# Patient Record
Sex: Female | Born: 1935 | Race: Black or African American | Hispanic: No | Marital: Single | Smoking: Never smoker
Health system: Southern US, Community
[De-identification: ages and names within clinical notes are randomized; demographics above are authoritative.]

## PROBLEM LIST (undated history)

## (undated) DIAGNOSIS — I340 Nonrheumatic mitral (valve) insufficiency: Secondary | ICD-10-CM

## (undated) DIAGNOSIS — Z95810 Presence of automatic (implantable) cardiac defibrillator: Secondary | ICD-10-CM

## (undated) DIAGNOSIS — I119 Hypertensive heart disease without heart failure: Secondary | ICD-10-CM

## (undated) DIAGNOSIS — I5022 Chronic systolic (congestive) heart failure: Secondary | ICD-10-CM

## (undated) DIAGNOSIS — I2721 Secondary pulmonary arterial hypertension: Secondary | ICD-10-CM

## (undated) DIAGNOSIS — N183 Chronic kidney disease, stage 3 unspecified: Secondary | ICD-10-CM

## (undated) DIAGNOSIS — I428 Other cardiomyopathies: Secondary | ICD-10-CM

## (undated) DIAGNOSIS — I071 Rheumatic tricuspid insufficiency: Secondary | ICD-10-CM

---

## 2006-04-26 HISTORY — PX: CARDIAC CATHETERIZATION: SHX172

## 2006-06-29 ENCOUNTER — Inpatient Hospital Stay (HOSPITAL_COMMUNITY): Admission: EM | Admit: 2006-06-29 | Discharge: 2006-07-07 | Payer: Self-pay | Admitting: Emergency Medicine

## 2006-06-29 ENCOUNTER — Ambulatory Visit: Payer: Self-pay | Admitting: Cardiology

## 2006-06-30 ENCOUNTER — Encounter: Payer: Self-pay | Admitting: Cardiology

## 2006-07-26 ENCOUNTER — Ambulatory Visit: Payer: Self-pay | Admitting: Cardiology

## 2006-07-26 LAB — CONVERTED CEMR LAB
BUN: 24 mg/dL — ABNORMAL HIGH (ref 6–23)
Calcium: 9.3 mg/dL (ref 8.4–10.5)
Chloride: 98 meq/L (ref 96–112)
GFR calc Af Amer: 57 mL/min
GFR calc non Af Amer: 47 mL/min
Pro B Natriuretic peptide (BNP): 1350 pg/mL — ABNORMAL HIGH (ref 0.0–100.0)

## 2006-08-04 ENCOUNTER — Ambulatory Visit: Payer: Self-pay | Admitting: Cardiology

## 2006-09-05 ENCOUNTER — Ambulatory Visit: Payer: Self-pay | Admitting: Cardiology

## 2006-09-05 LAB — CONVERTED CEMR LAB
BUN: 21 mg/dL (ref 6–23)
Calcium: 9.2 mg/dL (ref 8.4–10.5)
Chloride: 106 meq/L (ref 96–112)
GFR calc Af Amer: 63 mL/min
GFR calc non Af Amer: 52 mL/min
Pro B Natriuretic peptide (BNP): 1321 pg/mL — ABNORMAL HIGH (ref 0.0–100.0)

## 2006-11-29 ENCOUNTER — Ambulatory Visit: Payer: Self-pay | Admitting: Internal Medicine

## 2006-11-29 LAB — CONVERTED CEMR LAB
BUN: 27 mg/dL — ABNORMAL HIGH (ref 6–23)
Calcium: 9.4 mg/dL (ref 8.4–10.5)
Chloride: 102 meq/L (ref 96–112)
GFR calc Af Amer: 57 mL/min
GFR calc non Af Amer: 47 mL/min
Glucose, Bld: 93 mg/dL (ref 70–99)
Pro B Natriuretic peptide (BNP): 616 pg/mL — ABNORMAL HIGH (ref 0.0–100.0)

## 2006-12-13 ENCOUNTER — Ambulatory Visit: Payer: Self-pay

## 2006-12-29 ENCOUNTER — Ambulatory Visit: Payer: Self-pay | Admitting: Internal Medicine

## 2006-12-29 LAB — CONVERTED CEMR LAB
Calcium: 9.2 mg/dL (ref 8.4–10.5)
GFR calc Af Amer: 48 mL/min
GFR calc non Af Amer: 40 mL/min
Glucose, Bld: 103 mg/dL — ABNORMAL HIGH (ref 70–99)

## 2007-01-12 ENCOUNTER — Ambulatory Visit: Payer: Self-pay | Admitting: Internal Medicine

## 2007-01-12 LAB — CONVERTED CEMR LAB
BUN: 35 mg/dL — ABNORMAL HIGH (ref 6–23)
Chloride: 102 meq/L (ref 96–112)
GFR calc non Af Amer: 40 mL/min
Potassium: 4.4 meq/L (ref 3.5–5.1)
Pro B Natriuretic peptide (BNP): 643 pg/mL — ABNORMAL HIGH (ref 0.0–100.0)
Sodium: 140 meq/L (ref 135–145)

## 2007-02-14 ENCOUNTER — Ambulatory Visit: Payer: Self-pay | Admitting: Internal Medicine

## 2007-03-27 ENCOUNTER — Ambulatory Visit: Payer: Self-pay | Admitting: Internal Medicine

## 2007-03-27 LAB — CONVERTED CEMR LAB
BUN: 62 mg/dL — ABNORMAL HIGH (ref 6–23)
Chloride: 103 meq/L (ref 96–112)
GFR calc non Af Amer: 28 mL/min
Potassium: 4.7 meq/L (ref 3.5–5.1)
Pro B Natriuretic peptide (BNP): 554 pg/mL — ABNORMAL HIGH (ref 0.0–100.0)
Sodium: 138 meq/L (ref 135–145)

## 2007-04-27 HISTORY — PX: CARDIAC DEFIBRILLATOR PLACEMENT: SHX171

## 2007-05-02 ENCOUNTER — Ambulatory Visit: Payer: Self-pay

## 2007-05-02 ENCOUNTER — Encounter: Payer: Self-pay | Admitting: Internal Medicine

## 2007-05-19 ENCOUNTER — Ambulatory Visit: Payer: Self-pay | Admitting: Internal Medicine

## 2007-05-30 ENCOUNTER — Inpatient Hospital Stay (HOSPITAL_COMMUNITY): Admission: AD | Admit: 2007-05-30 | Discharge: 2007-05-31 | Payer: Self-pay | Admitting: Internal Medicine

## 2007-05-30 ENCOUNTER — Ambulatory Visit: Payer: Self-pay | Admitting: Internal Medicine

## 2007-06-14 ENCOUNTER — Ambulatory Visit: Payer: Self-pay | Admitting: Internal Medicine

## 2007-06-14 ENCOUNTER — Ambulatory Visit: Payer: Self-pay

## 2007-06-14 LAB — CONVERTED CEMR LAB
GFR calc non Af Amer: 34 mL/min
Potassium: 4.1 meq/L (ref 3.5–5.1)
Sodium: 138 meq/L (ref 135–145)

## 2007-06-21 ENCOUNTER — Ambulatory Visit: Payer: Self-pay | Admitting: Cardiology

## 2007-08-18 ENCOUNTER — Ambulatory Visit: Payer: Self-pay | Admitting: Cardiology

## 2007-08-29 ENCOUNTER — Ambulatory Visit: Payer: Self-pay | Admitting: Internal Medicine

## 2007-08-29 ENCOUNTER — Ambulatory Visit: Payer: Self-pay | Admitting: Cardiology

## 2007-08-29 LAB — CONVERTED CEMR LAB
Calcium: 9.2 mg/dL (ref 8.4–10.5)
GFR calc Af Amer: 44 mL/min
GFR calc non Af Amer: 36 mL/min
Sodium: 138 meq/L (ref 135–145)

## 2007-10-24 ENCOUNTER — Ambulatory Visit: Payer: Self-pay | Admitting: Internal Medicine

## 2007-12-28 ENCOUNTER — Ambulatory Visit: Payer: Self-pay | Admitting: Internal Medicine

## 2008-01-23 ENCOUNTER — Ambulatory Visit: Payer: Self-pay | Admitting: Cardiology

## 2008-01-23 LAB — CONVERTED CEMR LAB
GFR calc Af Amer: 44 mL/min
GFR calc non Af Amer: 36 mL/min
Potassium: 4.2 meq/L (ref 3.5–5.1)
Sodium: 138 meq/L (ref 135–145)

## 2008-03-29 ENCOUNTER — Ambulatory Visit: Payer: Self-pay | Admitting: Internal Medicine

## 2008-04-16 ENCOUNTER — Ambulatory Visit: Payer: Self-pay | Admitting: Internal Medicine

## 2008-04-16 DIAGNOSIS — I110 Hypertensive heart disease with heart failure: Secondary | ICD-10-CM | POA: Insufficient documentation

## 2008-04-16 DIAGNOSIS — Z95810 Presence of automatic (implantable) cardiac defibrillator: Secondary | ICD-10-CM

## 2008-04-16 DIAGNOSIS — I447 Left bundle-branch block, unspecified: Secondary | ICD-10-CM

## 2008-04-16 LAB — CONVERTED CEMR LAB
BUN: 38 mg/dL — ABNORMAL HIGH (ref 6–23)
GFR calc Af Amer: 41 mL/min
GFR calc non Af Amer: 34 mL/min
Potassium: 4.3 meq/L (ref 3.5–5.1)
Sodium: 138 meq/L (ref 135–145)

## 2008-07-05 ENCOUNTER — Encounter: Payer: Self-pay | Admitting: Internal Medicine

## 2008-07-10 ENCOUNTER — Ambulatory Visit: Payer: Self-pay | Admitting: Internal Medicine

## 2008-07-10 ENCOUNTER — Encounter: Payer: Self-pay | Admitting: Internal Medicine

## 2008-07-10 DIAGNOSIS — N182 Chronic kidney disease, stage 2 (mild): Secondary | ICD-10-CM | POA: Insufficient documentation

## 2008-07-10 DIAGNOSIS — I428 Other cardiomyopathies: Secondary | ICD-10-CM | POA: Insufficient documentation

## 2008-10-08 ENCOUNTER — Encounter: Payer: Self-pay | Admitting: Internal Medicine

## 2008-10-08 ENCOUNTER — Ambulatory Visit: Payer: Self-pay | Admitting: Internal Medicine

## 2008-10-09 ENCOUNTER — Telehealth (INDEPENDENT_AMBULATORY_CARE_PROVIDER_SITE_OTHER): Payer: Self-pay | Admitting: Nurse Practitioner

## 2008-10-09 LAB — CONVERTED CEMR LAB
BUN: 53 mg/dL — ABNORMAL HIGH (ref 6–23)
GFR calc non Af Amer: 33.45 mL/min (ref >60)
Potassium: 4.8 meq/L (ref 3.5–5.1)
Pro B Natriuretic peptide (BNP): 653 pg/mL — ABNORMAL HIGH (ref 0.0–100.0)
Sodium: 137 meq/L (ref 135–145)

## 2008-10-11 ENCOUNTER — Telehealth (INDEPENDENT_AMBULATORY_CARE_PROVIDER_SITE_OTHER): Payer: Self-pay | Admitting: Nurse Practitioner

## 2008-10-14 ENCOUNTER — Ambulatory Visit: Payer: Self-pay | Admitting: Nurse Practitioner

## 2008-10-17 LAB — CONVERTED CEMR LAB
Calcium: 9.1 mg/dL (ref 8.4–10.5)
Chloride: 102 meq/L (ref 96–112)
Creatinine, Ser: 1.9 mg/dL — ABNORMAL HIGH (ref 0.4–1.2)
GFR calc non Af Amer: 33.45 mL/min (ref >60)
Pro B Natriuretic peptide (BNP): 541 pg/mL — ABNORMAL HIGH (ref 0.0–100.0)

## 2008-10-18 ENCOUNTER — Telehealth (INDEPENDENT_AMBULATORY_CARE_PROVIDER_SITE_OTHER): Payer: Self-pay | Admitting: Nurse Practitioner

## 2008-10-23 ENCOUNTER — Encounter (INDEPENDENT_AMBULATORY_CARE_PROVIDER_SITE_OTHER): Payer: Self-pay | Admitting: Nurse Practitioner

## 2008-11-01 ENCOUNTER — Telehealth: Payer: Self-pay | Admitting: Cardiology

## 2008-11-18 ENCOUNTER — Ambulatory Visit: Payer: Self-pay | Admitting: Cardiology

## 2008-11-20 LAB — CONVERTED CEMR LAB
CO2: 28 meq/L (ref 19–32)
Calcium: 8.9 mg/dL (ref 8.4–10.5)
GFR calc non Af Amer: 33.44 mL/min (ref >60)
Sodium: 140 meq/L (ref 135–145)

## 2008-11-28 ENCOUNTER — Telehealth (INDEPENDENT_AMBULATORY_CARE_PROVIDER_SITE_OTHER): Payer: Self-pay | Admitting: *Deleted

## 2008-12-26 ENCOUNTER — Ambulatory Visit: Payer: Self-pay | Admitting: Internal Medicine

## 2009-02-27 ENCOUNTER — Ambulatory Visit: Payer: Self-pay | Admitting: Internal Medicine

## 2009-03-03 ENCOUNTER — Encounter: Payer: Self-pay | Admitting: Internal Medicine

## 2009-03-17 ENCOUNTER — Encounter (INDEPENDENT_AMBULATORY_CARE_PROVIDER_SITE_OTHER): Payer: Self-pay | Admitting: *Deleted

## 2009-03-28 ENCOUNTER — Telehealth: Payer: Self-pay | Admitting: Cardiology

## 2009-04-15 ENCOUNTER — Ambulatory Visit: Payer: Self-pay | Admitting: Internal Medicine

## 2009-04-15 LAB — CONVERTED CEMR LAB
Chloride: 100 meq/L (ref 96–112)
Potassium: 4.2 meq/L (ref 3.5–5.1)
Pro B Natriuretic peptide (BNP): 475 pg/mL — ABNORMAL HIGH (ref 0.0–100.0)
Sodium: 137 meq/L (ref 135–145)

## 2009-04-17 ENCOUNTER — Encounter: Payer: Self-pay | Admitting: Cardiology

## 2009-07-22 ENCOUNTER — Ambulatory Visit: Payer: Self-pay | Admitting: Internal Medicine

## 2009-07-22 DIAGNOSIS — I5022 Chronic systolic (congestive) heart failure: Secondary | ICD-10-CM

## 2009-10-23 ENCOUNTER — Ambulatory Visit: Payer: Self-pay | Admitting: Internal Medicine

## 2010-01-22 ENCOUNTER — Ambulatory Visit: Payer: Self-pay | Admitting: Internal Medicine

## 2010-01-28 ENCOUNTER — Ambulatory Visit (HOSPITAL_COMMUNITY): Admission: RE | Admit: 2010-01-28 | Discharge: 2010-01-28 | Payer: Self-pay | Admitting: Internal Medicine

## 2010-01-28 ENCOUNTER — Encounter: Payer: Self-pay | Admitting: Internal Medicine

## 2010-01-28 ENCOUNTER — Ambulatory Visit: Payer: Self-pay

## 2010-01-28 ENCOUNTER — Ambulatory Visit: Payer: Self-pay | Admitting: Cardiovascular Disease

## 2010-04-06 ENCOUNTER — Telehealth: Payer: Self-pay | Admitting: Internal Medicine

## 2010-04-07 ENCOUNTER — Ambulatory Visit: Payer: Self-pay | Admitting: Internal Medicine

## 2010-04-07 ENCOUNTER — Encounter: Payer: Self-pay | Admitting: Internal Medicine

## 2010-04-07 DIAGNOSIS — I4902 Ventricular flutter: Secondary | ICD-10-CM

## 2010-04-22 ENCOUNTER — Telehealth: Payer: Self-pay | Admitting: Internal Medicine

## 2010-05-07 ENCOUNTER — Ambulatory Visit
Admission: RE | Admit: 2010-05-07 | Discharge: 2010-05-07 | Payer: Self-pay | Source: Home / Self Care | Attending: Internal Medicine | Admitting: Internal Medicine

## 2010-05-07 ENCOUNTER — Other Ambulatory Visit: Payer: Self-pay | Admitting: Internal Medicine

## 2010-05-07 LAB — BASIC METABOLIC PANEL
BUN: 35 mg/dL — ABNORMAL HIGH (ref 6–23)
CO2: 28 mEq/L (ref 19–32)
Calcium: 8.8 mg/dL (ref 8.4–10.5)
Chloride: 103 mEq/L (ref 96–112)
Creatinine, Ser: 1.5 mg/dL — ABNORMAL HIGH (ref 0.4–1.2)
GFR: 42.76 mL/min — ABNORMAL LOW (ref >60.00)
Glucose, Bld: 93 mg/dL (ref 70–99)
Potassium: 4.3 mEq/L (ref 3.5–5.1)
Sodium: 136 mEq/L (ref 135–145)

## 2010-05-26 NOTE — Cardiovascular Report (Signed)
Summary: Office Visit Remote   Office Visit Remote   Imported By: Roderic Ovens 11/19/2009 16:06:21  _____________________________________________________________________  External Attachment:    Type:   Image     Comment:   External Document

## 2010-05-26 NOTE — Cardiovascular Report (Signed)
Summary: Office Visit Remote   Office Visit Remote   Imported By: Roderic Ovens 03/05/2010 15:18:56  _____________________________________________________________________  External Attachment:    Type:   Image     Comment:   External Document

## 2010-05-26 NOTE — Assessment & Plan Note (Signed)
Summary: 1 YR/GUIDENT/DMP   Visit Type:  Pacemaker check Primary Provider:  no   History of Present Illness: Michele Caldwell is seen in for congestivefailure in the context of nonischemic cardiomyopathy with LBBB.  She is status post ICD implantation without LV lead that was complicated by high defibrillation threshold requiring the use of a Guidant high-voltage device. Functional status preprocedure was class 1-2 and she did not receive CRT.   She is doing very well. She has a minimal limitations in her exercise tolerance she is able to walk around [place]. Sh  has no edema;  she is having no chest pain.     Current Medications (verified): 1)  Furosemide 40 Mg Tabs (Furosemide) .... Take One Tablet By Mouth Daily. 2)  Carvedilol 12.5 Mg Tabs (Carvedilol) .[name].. 1 1/2 Two Times A Day 3)  Ramipril 1.25 Mg Caps (Ramipril) .... Take One Capsule By Mouth Daily 4)  Aspirin 81 Mg Tbec (Aspirin) .... Take One Tablet By Mouth Daily 5)  Spironolactone 25 Mg Tabs (Spironolactone) .... Take One Tablet By Mouth Daily  Allergies (verified): No Known Drug Allergies  Vital Signs:  Patient profile:   76 year old female Height:      66 inches Weight:      196 pounds BMI:     31.75 Pulse rate:   70 / minute BP sitting:   109 / 68  (left arm) Cuff size:   large  Vitals Entered By: Oswald Hillock (July 22, 2009 9:25 AM)  Physical Exam  General:  The patient was alert and oriented in no acute distress. HEENT Normal.  Neck veins were flat, carotids were brisk.  Lungs were clear.  Heart sounds were regular without murmurs or gallops.  Abdomen was soft with active bowel sounds. There is no clubbing cyanosis or edema. Skin Warm and dry     ICD Specifications Following MD:  Sherryl Manges, MD     Referring MD:  [facility] ICD Vendor:  [facility] [employer]     ICD Model Number:  [phone]     ICD Serial Number:  [id number] ICD DOI:  05/30/2007     ICD Implanting MD:  Sherryl Manges, MD  Lead 1:     Location: RV     DOI: 05/30/2007     Model #: [id number]     Serial #: [id number]     Status: active  Indications::  NICM   ICD Follow Up Remote Check?  No Battery Voltage:  2.98 V     Charge Time:  8.7 seconds     Underlying rhythm:  SR ICD Dependent:  No       ICD Device Measurements Right Ventricle:  Amplitude: 25 mV, Impedance: 548 ohms, Threshold: 0.6 V at 0.5 msec  Episodes Coumadin:  No Shock:  0     ATP:  0     Nonsustained:  0     Ventricular Pacing:  0%  Brady Parameters Mode VVI     Lower Rate Limit:  40      Tachy Zones VF:  240     VT:  210     VT1:  180     Next Remote Date:  10/23/2009     Next Cardiology Appt Due:  06/25/2010 Tech Comments:  No parameter changes.  Device function normal.  Checked by industry.  Latitude transmissions every 3 months.  ROV 1 year with Dr. Graciela Husbands. Altha Harm, LPN  July 22, 2009 9:36 AM   Impression & Recommendations:  Problem # 1:  SYSTOLIC HEART FAILURE, CHRONIC (ICD-428.22) patient's heart failure status is stable. Her blood pressure precludes further up titration of her carvedilol. Her updated medication list for this problem includes:    Furosemide 40 Mg Tabs (Furosemide) .[name]... Take one tablet by mouth daily.    Carvedilol 12.5 Mg Tabs (Carvedilol) .[name]... 1 1/2 two times a day    Ramipril 1.25 Mg Caps (Ramipril) .[name]... Take one capsule by mouth daily    Aspirin 81 Mg Tbec (Aspirin) .[name]... Take one tablet by mouth daily    Spironolactone 25 Mg Tabs (Spironolactone) .[name]... Take one tablet by mouth daily  Problem # 2:  CARDIOMYOPATHY, PRIMARY, DILATED (ICD-425.4) Will plan to reassess left ventricular function by echocardiography Her updated medication list for this problem includes:    Furosemide 40 Mg Tabs (Furosemide) .[name]... Take one tablet by mouth daily.    Carvedilol 12.5 Mg Tabs (Carvedilol) .[name]... 1 1/2 two times a day    Ramipril 1.25 Mg Caps (Ramipril) .[name]... Take one capsule by mouth daily    Aspirin 81 Mg Tbec (Aspirin) .[name]... Take  one tablet by mouth daily    Spironolactone 25 Mg Tabs (Spironolactone) .[name]... Take one tablet by mouth daily  Problem # 3:  ICD - IN SITU (ICD-V45.02) Device parameters and data were reviewed and no changes were made; She has had no intercurrent episodes and is not ventricular pacing.  Patient Instructions: 1)  Your physician recommends that you continue on your current medications as directed. Please refer to the Current Medication list given to you today. 2)  Your physician wants you to follow-up in: 6 MONTHS WITH DR Graciela Husbands.  You will receive a reminder letter in the mail two months in advance. If you don't receive a letter, please call our office to schedule the follow-up appointment.

## 2010-05-28 LAB — CONVERTED CEMR LAB
BUN: 56 mg/dL — ABNORMAL HIGH
CO2: 26 meq/L
Calcium: 9.1 mg/dL
Chloride: 103 meq/L
Creatinine, Ser: 2 mg/dL — ABNORMAL HIGH
GFR calc non Af Amer: 31.04 mL/min — ABNORMAL LOW
Glucose, Bld: 86 mg/dL
Magnesium: 2.4 mg/dL
Potassium: 4.6 meq/L
Sodium: 138 meq/L

## 2010-05-28 NOTE — Assessment & Plan Note (Signed)
Summary: shob, dizzy, tired-per granddaughter   Visit Type:  Follow-up Primary Provider:  no  CC:  tired, SOB, dizziness, and trouble seeing.  History of Present Illness: [name] is seen in for congestivefailure in the context of nonischemic cardiomyopathy with LBBB.  She is status post ICD implantation without LV lead that was complicated by high defibrillation threshold requiring the use of a Guidant high-voltage device. Functional status preprocedure was class 1-2 and she did not receive CRT intentionally. This was pre-MADIT-CRT.   She is doing very well. She has a minimal limitations in her exercise tolerance she is able to walk around [place]. Sh  has no edema;  she is having no chest pain.  she did have a fall the other day.  she claims there is no antecedent symptoms. Her great grandson witnessed  The patient denies SOB, chest pain, edema or palpitations, Despite the concerns are her granddaughter      Problems Prior to Update: 1)  Ventricular Flutter  (ICD-427.42) 2)  Cardiomyopathy, Primary, Dilated  (ICD-425.4) 3)  Systolic Heart Failure, Chronic  (ICD-428.22) 4)  Lbbb  (ICD-426.3) 5)  Icd - in Situ  (ICD-V45.02) 6)  Hypertension, Heart Controlled w/ CHF  (ICD-402.11) 7)  Renal Disease, Chronic, Stage II  (ICD-585.2)  Current Medications (verified): 1)  Furosemide 40 Mg Tabs (Furosemide) .... Take One Tablet By Mouth Daily. 2)  Carvedilol 12.5 Mg Tabs (Carvedilol) .[name].. 1 1/2 Two Times A Day 3)  Ramipril 1.25 Mg Caps (Ramipril) .... Take One Capsule By Mouth Daily 4)  Aspirin 81 Mg Tbec (Aspirin) .... Take One Tablet By Mouth Daily 5)  Spironolactone 25 Mg Tabs (Spironolactone) .... Take One Tablet By Mouth Daily  Allergies (verified): No Known Drug Allergies  Past History:  Past Medical History: Last updated: 07/09/2008 Current Problems:  PACEMAKER (ICD-V45.[name]01) LBBB (ICD-426.3) ICD - IN SITU (ICD-V45.02) HYPERTENSION, HEART CONTROLLED W/ CHF  (ICD-402.11) SYSTOLIC HEART FAILURE, ACUTE ON CHRONIC (ICD-428.23) ICD-Single Chamber (S/P) Chronic renal insufficiency  Family History: Last updated: 04/16/2008 Family History of Cancer:  Family History of CHF  Social History: Last updated: 04/16/2008 Married lives in [place] w/husband Tobacco Use - No.  Alcohol Use - no Regular Exercise - no Drug Use - no  Risk Factors: Exercise: no (04/16/2008)  Risk Factors: Smoking Status: never (04/16/2008)  Vital Signs:  Patient profile:   76 year old female Height:      66 inches Weight:      194.75 pounds BMI:     31.55 Pulse rate:   84 / minute BP sitting:   122 / 68  (left arm) Cuff size:   large  Vitals Entered By: Caralee Ates CMA (April 07, 2010 4:14 PM)  Physical Exam  General:  The patient was alert and oriented in no acute distress. HEENT Normal.  Neck veins were flat, carotids were brisk.  Lungs were clear.  Heart sounds were regular without murmurs or gallops.  Abdomen was soft with active bowel sounds. There is no clubbing cyanosis or edema. Skin Warm and dry     ICD Specifications Following MD:  Sherryl Manges, MD     Referring MD:  [facility] ICD Vendor:  [facility] [employer]     ICD Model Number:  [phone]     ICD Serial Number:  [id number] ICD DOI:  05/30/2007     ICD Implanting MD:  Sherryl Manges, MD  Lead 1:    Location: RV     DOI: 05/30/2007  Model #: [id number]     Serial #: [id number]     Status: active  Indications::  NICM   ICD Follow Up Remote Check?  No Battery Voltage:  2.90 V     Charge Time:  8.7 seconds     Battery Est. Longevity:  BOL ICD Dependent:  No       ICD Device Measurements Right Ventricle:  Impedance: 529 ohms, Threshold: 0.4 V at 0.4 msec Shock Impedance: 47 ohms   Episodes Coumadin:  No Shock:  1     ATP:  0     Nonsustained:  0     ICD Appropriate Therapy?  Yes Ventricular Pacing:  0%  Brady Parameters Mode VVI     Lower Rate Limit:  40      Tachy Zones VF:  240     VT:   210     VT1:  180     Next Remote Date:  07/09/2010     Next Cardiology Appt Due:  03/27/2011 Tech Comments:  No parameter changes.  Device function normal.  1 VF episode treated successfully with 41J shock.  She did pass out with this episode.  Latitude transmissions every 3 months.   ROV 1 year with Dr.Klein.  Checked by [employer]. Altha Harm, LPN  April 07, 2010 4:45 PM   Impression & Recommendations:  Problem # 1:  CARDIOMYOPATHY, PRIMARY, DILATED (ICD-425.4)  stable on current medications. We'll check a metabolic profile and her magnesium today. Her updated medication list for this problem includes:    Furosemide 40 Mg Tabs (Furosemide) .[name]... Take one tablet by mouth daily.    Carvedilol 12.5 Mg Tabs (Carvedilol) .[name]... 1 1/2 two times a day    Ramipril 1.25 Mg Caps (Ramipril) .[name]... Take one capsule by mouth daily    Aspirin 81 Mg Tbec (Aspirin) .[name]... Take one tablet by mouth daily    Spironolactone 25 Mg Tabs (Spironolactone) .[name]... Take one tablet by mouth daily  Orders: TLB-BMP (Basic Metabolic Panel-BMET) (80048-METABOL) TLB-Magnesium (Mg) (83735-MG)  Problem # 2:  LBBB (ICD-426.3)  at time of generator replacement, should be a candidate for CRT upgrade Her updated medication list for this problem includes:    Carvedilol 12.5 Mg Tabs (Carvedilol) .[name]... 1 1/2 two times a day    Ramipril 1.25 Mg Caps (Ramipril) .[name]... Take one capsule by mouth daily    Aspirin 81 Mg Tbec (Aspirin) .[name]... Take one tablet by mouth daily  Orders: TLB-BMP (Basic Metabolic Panel-BMET) (80048-METABOL) TLB-Magnesium (Mg) (83735-MG)  Problem # 3:  HYPERTENSION, HEART CONTROLLED W/ CHF (ICD-402.11) blood pressure is well controlled Her updated medication list for this problem includes:    Furosemide 40 Mg Tabs (Furosemide) .[name]... Take one tablet by mouth daily.    Carvedilol 12.5 Mg Tabs (Carvedilol) .[name]... 1 1/2 two times a day    Ramipril 1.25 Mg Caps (Ramipril) .[name]... Take one capsule by mouth  daily    Aspirin 81 Mg Tbec (Aspirin) .[name]... Take one tablet by mouth daily    Spironolactone 25 Mg Tabs (Spironolactone) .[name]... Take one tablet by mouth daily  Problem # 4:  SYSTOLIC HEART FAILURE, CHRONIC (ICD-428.22)  stable on her current medications  Orders: TLB-BMP (Basic Metabolic Panel-BMET) (80048-METABOL) TLB-Magnesium (Mg) (83735-MG)  Problem # 5:  VENTRICULAR FLUTTER (ICD-427.42) The patient had episode of ventricular flutter that precipitated her fall. It was treated with a high voltage shock the impedance of which was 44 ohms.we will restrict her driving Her updated medication list  for this problem includes:    Carvedilol 12.5 Mg Tabs (Carvedilol) .[name]... 1 1/2 two times a day    Ramipril 1.25 Mg Caps (Ramipril) .[name]... Take one capsule by mouth daily    Aspirin 81 Mg Tbec (Aspirin) .[name]... Take one tablet by mouth daily  Problem # 6:  ICD - IN SITU (ICD-V45.02) Device parameters and data were reviewed and no changes were made  Patient Instructions: 1)  Your physician recommends that you schedule a follow-up appointment in: 3 months with [facility] 2)  Your physician recommends that you have lab work today. 3)  Your physician recommends that you continue on your current medications as directed. Please refer to the Current Medication list given to you today. 4)  Your physician wants you to follow-up in: 6 months with Dr. Graciela Husbands.  You will receive a reminder letter in the mail two months in advance. If you don't receive a letter, please call our office to schedule the follow-up appointment.

## 2010-05-28 NOTE — Progress Notes (Signed)
Summary: pt rtn call to get lab results  Phone Note Call from Patient Call back at Home Phone [phone]   Caller: Patient Reason for Call: Talk to Nurse, Talk to Doctor, Lab or Test Results Summary of Call: pt rtn call to get lab work results Initial call taken by: Omer Jack,  April 22, 2010 12:58 PM  Follow-up for Phone Call        lmfcb Claris Gladden, RN, BSN 12/28 1323  Additional Follow-up for Phone Call Additional follow up Details #1::        pt returning your call Additional Follow-up by: Roe Coombs,  April 22, 2010 1:28 PM    Additional Follow-up for Phone Call Additional follow up Details #2::    pt not answering phone. Claris Gladden, RN, BSN 12/29 1357 adv pt of changes in med for next 2 weeks and then she will have labs checked on 1/13 at 3.  Follow-up by: Claris Gladden RN,  April 23, 2010 4:01 PM

## 2010-05-28 NOTE — Progress Notes (Signed)
Summary: pt's dtr wants call re pt  Phone Note Call from Patient   Caller: Daughter Blair Dolphin [phone] Reason for Call: Talk to Nurse Summary of Call: pt's dtr calling re symptoms pt having-not sure if needs appt, and if so if here or somewhere else Initial call taken by: Glynda Jaeger,  April 06, 2010 2:29 PM  Follow-up for Phone Call        spoke w/pt granddaughter and she adv that pt fell yesterday. when she questioned her grandmother she said she felt dizzy, couldn't see and didn't even know she had fallen. lately the pt has been tired and shob. adv granddaughter to bring her in tomorrow at 330.  Follow-up by: Claris Gladden RN,  April 06, 2010 3:37 PM

## 2010-07-09 ENCOUNTER — Encounter (INDEPENDENT_AMBULATORY_CARE_PROVIDER_SITE_OTHER): Payer: Medicare Other

## 2010-07-09 DIAGNOSIS — I428 Other cardiomyopathies: Secondary | ICD-10-CM

## 2010-09-08 NOTE — Assessment & Plan Note (Signed)
[facility]                          CHRONIC HEART FAILURE NOTE   CADENCE, MINTON                         MRN:          [id number]  DATE:03/27/2007                            DOB:          Jan 04, 1935    PRIMARY CARDIOLOGIST:  Dr. Jerral Bonito.   PRIMARY CARE:  Dr. Katrinka Blazing at [facility] in [place].   Michele Caldwell returns today for further followup of her congestive heart  failure, which is secondary to nonischemic cardiomyopathy with an EF  currently 10-15% by echocardiogram in August of this year.  [name]  heart failure is felt to be predominantly secondary to hypertension with  a history of noncompliance.  Ms. Michele Caldwell states she has been doing well  since I last saw her in October.  I have tried twice since September to  schedule her for CPX test.  Both times, she has cancelled and still has  not completed the test.  She states she has been too busy and  transportation is an issue at times if her grand daughter cannot bring  her.  She states she has done well through the Thanksgiving holidays.  She did a tremendous amount of cooking as the family comes to her house.  Her grand daughter, who accompanies her today, states that she did not  rest very well as she was up late each night in preparation for the  feast, although [name] states she does not feel any more tired than  usual.  She has rested significantly over the last few days and feels  like she is back to her baseline.  She states compliance with her diet  through the holidays, although her weight is up 2 pounds today.  She  does not feel that she is in volume overload.  She denies any shortness  of breath, orthopnea, or PND.  She complains of a nonproductive cough in  the setting of sinus drainage without fever, chills, or facial  tenderness.   PAST MEDICAL HISTORY:  1. Congestive heart failure secondary to nonischemic cardiomyopathy      with an EF currently of 10-15% by  echocardiogram earlier this year.  2. Normal coronary arteries by cardiac catheterization.  3. History of poorly controlled hypertension.  4. Left bundle branch block.  5. Medical noncompliance.   REVIEW OF SYSTEMS:  As stated above.   CURRENT MEDICATIONS:  1. Include Altace 2.5 mg daily.  2. Aspirin 81 daily.  3. Lasix 80 mg daily.  4. Spironolactone 25 mg daily.  5. Carvedilol 25 mg b.i.d.   PHYSICAL EXAM:  Weight 194, blood pressure 107/63 with a heart rate of  60.  [name] is in no acute distress.  She has JVD 8 to 10 cm at a 45  degree angle.  LUNGS:  Clear to auscultation bilaterally.  CARDIOVASCULAR:  Reveals an S1, S2.  Regular rate and rhythm.  2/6  systolic ejection murmur noted.  ABDOMEN:  Soft and nontender.  Positive bowel sounds.  Obese.  LOWER EXTREMITIES:  Without cyanosis, clubbing, or edema.  NEUROLOGIC:  Alert and oriented  x3.   IMPRESSION:  Congestive heart failure secondary to nonischemic  cardiomyopathy with an ejection fraction currently 10-15%.  I do not  plan on rescheduling the CPX test as Ms. Tavella has failed to show up  twice before.  She is agreeable to proceeding with a 2D echocardiogram  for further evaluation.  If not improvement in ejection fraction at this  time, will plan on referring the patient to Dr. Lewayne Bunting for  implantable cardioverter defibrillator consideration.  Will continue  Coreg at 25 mg b.i.d.  Plan on repeating lab work as it has been 3  months since her testing, and will arrange for the patient to follow up  with Dr. Myrtis Ser for a routine cardiology visit at that time.  I have  spoken with Rella and her grand daughter on several occasions about the  possible need for an ICD if there is no improvement in patient's EF. At  this time, they are receptive to the idea. Hopefully, Michele Caldwell will  keep her appointment for the echocardiogram, as she has a tendency to  miss appointments.      Dorian Pod, ACNP   Electronically Signed      Bevelyn Buckles. Bensimhon, MD  Electronically Signed   MB/MedQ  DD: 03/27/2007  DT: 03/27/2007  Job #: [id number]

## 2010-09-08 NOTE — Assessment & Plan Note (Signed)
[facility]                          CHRONIC HEART FAILURE NOTE   Michele, Caldwell                         MRN:          [id number]  DATE:01/23/2008                            DOB:          10-03-1934    PRIMARY CARDIOLOGIST:  Luis Abed, MD, [facility]   PRIMARY CARE PHYSICIAN:  Audrea Muscat, NP at [facility] in  [place].   ELECTROPHYSIOLOGY:  Duke Salvia, MD, [facility] returns today for further followup of her congestive heart  failure, which is secondary to nonischemic cardiomyopathy.  She has done  quite well since I last saw her in June.  She states her weight has been  consistent at home.  She denies any symptoms suggestive of volume  overload including orthopnea or PND.  Denies any palpitations,  lightheadedness, or dizziness.  Denies any firing from her  defibrillator.  The only thing in question today is her carvedilol.  She  is supposed to be on 25 mg b.i.d.  She comes in today with a  prescription stating 3.125 mg b.i.d.  I have not made any changes in her  carvedilol dose.  It is unclear as to how she ended up with a 3-mg  tablet.  She states she has been taking this dose for a couple of weeks  since she got her prescription renewed.   PAST MEDICAL HISTORY:  1. Congestive heart failure secondary to nonischemic cardiomyopathy      with EF 10-15% by echocardiogram.  2. Status post implantation of ICD [facility] scientific device.  3. Left bundle-branch block.  4. Chronic renal insufficiency.  Baseline appears to be around 1.6.  5. Remote history of medical noncompliance.  6. History of poorly controlled hypertension.  7. Previous catheterization showing normal coronary anatomy.   REVIEW OF SYSTEMS:  As stated above, otherwise negative.   CURRENT MEDICATIONS:  1. Aspirin 81 mg daily.  2. Spironolactone 25 mg daily.  3. Altace 1.25 mg daily.  4. Lasix 40 mg daily.  5. Carvedilol should be 25 mg b.i.d.  The patient  is taking 3.125 mg      b.i.d.  Unclear as to how she ended up with a prescription for that      dose.   PHYSICAL EXAMINATION:  VITAL SIGNS:  Weight 198, blood pressure 117/75  with a heart rate of 85.  GENERAL:  Michele Caldwell is in no acute distress.  NECK:  No signs of jugular vein distention at 45-degree angle.  LUNGS:  Clear to auscultation bilaterally.  CARDIOVASCULAR:  Reveals an S1 and S2.  Regular rate and rhythm.  ABDOMEN:  Soft, nontender.  Positive bowel sounds.  LOWER EXTREMITIES:  Without clubbing, cyanosis, or edema.  NEUROLOGIC:  Alert and oriented x3.   IMPRESSION:  Congestive heart failure secondary to nonischemic  cardiomyopathy in the setting of previous medical and diet/medication  noncompliance with the EF currently 15% status post implantable  cardioverter-defibrillator implant.  Michele Caldwell continues to do quite well.  Has been very compliant over the last year without signs of volume  overload.  We  will continue current medications except for the  carvedilol.  I am going to increase that back to 6.25 mg b.i.d. and we  will check her blood work today.      Dorian Pod, ACNP  Electronically Signed      Bevelyn Buckles. Bensimhon, MD  Electronically Signed   MB/MedQ  DD: 01/23/2008  DT: 01/23/2008  Job #: [id number]   cc:   Audrea Muscat, NP

## 2010-09-08 NOTE — Assessment & Plan Note (Signed)
[facility]                          CHRONIC HEART FAILURE NOTE   Michele Caldwell, Michele Caldwell                         MRN:          [id number]  DATE:04/16/2008                            DOB:          [birth year]    PRIMARY CARDIOLOGIST:  Luis Abed, MD, [facility]   PRIMARY CARE PHYSICIAN:  Audrea Muscat, NP, at [facility]   ELECTROPHYSIOLOGIST:  Duke Salvia, MD, [facility] returns today for further followup of her congestive heart  failure, which is secondary to nonischemic cardiomyopathy.  I last saw  Michele Caldwell back in September.  She has done quite well at that time.  She  states compliance with medications.  Continues to remain fairly active  as far as doing housework, cooking, and cleaning with EF of 10-15%.  She  denies any symptoms suggestive of volume overload including orthopnea or  PND.  Denies any lightheadedness, dizziness, or palpitations.  Denies  any firing from her defibrillator.  States compliance with medications.   PAST MEDICAL HISTORY:  1. Congestive heart failure secondary to nonischemic cardiomyopathy      with an EF of 10-15% by echocardiogram.  2. Status post implantation of ICD [facility] scientific device.  3. Left bundle-branch block.  4. Chronic renal insufficiency.  5. Previous catheterization show normal coronary anatomy.   REVIEW OF SYSTEMS:  As stated above.   CURRENT MEDICATIONS:  1. Aspirin 81 mg.  2. Altace 1.25 mg.  3. Lasix 40 mg.  4. Carvedilol 6.25 mg b.i.d.  5. Spironolactone 25 mg.   PHYSICAL EXAMINATION:  VITAL SIGNS:  Weight 193 pounds, blood pressure  126/80, heart rate 86.  GENERAL:  Michele Caldwell is in no acute distress.  NECK:  No signs of jugular vein distention at 45-degree angle.  LUNGS:  Clear to auscultation.  CARDIOVASCULAR:  S1, S2.  Regular rate and rhythm.  ABDOMEN:  Soft, nontender, positive bowel sounds.  LOWER EXTREMITIES:  Without clubbing, cyanosis, or edema.  NEUROLOGICAL:  Alert and  oriented x3.   IMPRESSION:  Congestive heart failure secondary to nonischemic  cardiomyopathy in the setting of previous medical and diet/medication  noncompliance with ejection fraction currently 15%.  We will increase  her carvedilol to 9.375 mg b.i.d.  Michele Caldwell has an appointment with Dr.  Graciela Husbands in February.  We will plan on increasing her carvedilol to 12.5 mg  b.i.d. at that time.  We will check blood work on [employer] today.      Dorian Pod, ACNP  Electronically Signed      Bevelyn Buckles. Bensimhon, MD  Electronically Signed   MB/MedQ  DD: 04/16/2008  DT: 04/16/2008  Job #: [id number]   cc:   Audrea Muscat, NP

## 2010-09-08 NOTE — Op Note (Signed)
[name], Caldwell                ACCOUNT NO.:  [id number]   MEDICAL RECORD NO.:  [id number]          PATIENT TYPE:  INP   LOCATION:  2807                         FACILITY:  [facility]   PHYSICIAN:  Duke Salvia, MD, FACCDATE OF BIRTH:  [birth year]   DATE OF PROCEDURE:  05/30/2007  DATE OF DISCHARGE:                               OPERATIVE REPORT   PREOPERATIVE DIAGNOSIS:  Nonischemic cardiomyopathy with severe  depression of left ventricular systolic function.   POSTOPERATIVE DIAGNOSIS:  Nonischemic cardiomyopathy with severe  depression of left ventricular systolic function.   PROCEDURES:  1. Single-chamber defibrillator implantation.  2. Lead revision.  3. Intraoperative defibrillation threshold testing.   Following the obtaining of informed consent, the patient was brought to  the electrophysiology laboratory and placed on the fluoroscopic table in  the supine position.  After routine prep and drape of the left upper  chest, lidocaine was infiltrated in the subclavicular prepectoral  position.  An incision was made and carried down to the layer of the  prepectoral fascia using sharp dissection and electrocautery.  A pocket  was formed similarly.  Hemostasis was obtained.   Thereafter attention was turned to gaining access to extrathoracic left  subclavian vein, which was accomplished without difficulty without the  aspiration of air or puncture of the artery.  A single venipuncture was  accomplished.  A guidewire was placed and retained and then a 9-French  sheath was placed, through which was passed a Guidant [id number] dual-  coil active-fixation defibrillator lead.  With a fair amount of  difficulty we were finally able to get the lead out to the right  ventricular apex, where the bipolar R wave was 25 mV with a pace  impedance of 634 ohms, a threshold 0.6 V at 0.5 msec, current at  threshold was 1.0 mA.  There was no diaphragmatic pacing at 10 V, and  the current of  injury was adequate.  This lead was secured to the  prepectoral fascia and then attached to a [employer] II  ICD.  Through the device the bipolar R wave was 25 with a pace impedance  of 585, a threshold of 0.6 at 0.5.  High-voltage impedance was 36 ohms.   Defibrillation threshold testing was then undertaken.  Ventricular  fibrillation was induced via the T-wave shock.  After a total duration  of 5 seconds, a 14-joule shock was delivered through a measured  resistance of 37 ohms, failing to terminate ventricular fibrillation.  After a total duration of 12 seconds, a 21-joule shock was delivered  through a resistance of 37 ohms, terminating ventricular fibrillation  and restoring sinus rhythm.   After a wait of 5-6 minutes, ventricular fibrillation was reinduced via  the T-wave shock.  After a total duration of 7 seconds, a 21-joule shock  was delivered through a measured resistance of 37 ohms, failing to  terminate ventricular fibrillation, and the patient was shocked  externally.   After a wait of 5-6 minutes, ventricular fibrillation was reinduced via  the T-wave shock.  After a total duration of 7 seconds, a 21-joule  shock  was delivered through a reversed polarity at a measured resistance of 37  ohms, failing to terminate ventricular fibrillation, and the patient was  shocked externally to restore sinus rhythm.   At this point the previously-implanted device was explanted and a new  [employer] device, model [id number], was implanted, serial  number [id number].  The device was re-interrogated, parameters were stable.  Ventricular fibrillation was induced via the T-wave shock.  After a  total duration of 8 seconds, a 31-joule shock was delivered through a  measured resistance of 37 ohms, failing to terminate ventricular  fibrillation.  The patient was rescued externally.   At this point the lead was repositioned.  The proximal coil was  withdrawn about a  centimeter and a half, resulting in a little bit of  tautness in the distal portion of the lead.  Ventricular fibrillation  was induced after a wait of 5-6 minutes.  After a total duration of 8  seconds, a 37-joule shock was delivered through a measured resistance of  39 ohms, terminating ventricular fibrillation and restoring sinus  rhythm.  At this point the device was implanted.  I should note that the  pocket had been closed a couple of occasions up above.  The pocket was  irrigated copiously with saline prior to the last closure.  Hemostasis  was assured.  The leads and the pulse generator were placed in the  pocket and secured to the prepectoral fascia, and the wound was closed  in three layers in the normal fashion.  The wound was and washed and  dried and a benzoin and Steri-Strip dressing was applied.  Needle  counts, sponge counts and instrument counts were correct at the end of  procedure according to the staff.  The patient tolerated the procedure  without apparent complication.      Duke Salvia, MD, [facility]  Electronically Signed     SCK/MEDQ  D:  05/30/2007  T:  05/30/2007  Job:  [id number]   cc:   Electrophysiology Laboratory  [facility]

## 2010-09-08 NOTE — Assessment & Plan Note (Signed)
[facility]                          CHRONIC HEART FAILURE NOTE   CINTHYA, BORS                         MRN:          [id number]  DATE:11/29/2006                            DOB:          [birth year]    Ms. Sartwell returns today for followup of her congestive heart failure  which is secondary to nonischemic cardiomyopathy with the EF currently  20% by cardiac catheterization.  Heart failure felt to be predominantly  secondary to hypertension.  I have not seen Ms. Soderholm since May of this  year, at which time she was stable and scheduled for a 2 D  echocardiogram for full evaluation.  She cancelled that appointment and  has not rescheduled it yet.  She returns today with her granddaughter,  states she has been doing well.  Her granddaughter is concerned that her  diet is no longer heart healthy, sodium restricted.  She states her  grandmother is eating a lot of fast food and her weight has gone up 11  pounds over the summer.  Ms. Spitzley states she does not eat that much  fast food, but when she does, she likes to go to [employer] and [place]  friend chicken.  She denies any orthopnea or PND, no shortness of  breath, no peripheral edema.  However upon further discussion she has  been taking her Carvedilol instead of taking 6.25 mg.  Otherwise she  states she is taking her medications as described.  She denies any  episodes of chest discomfort.   PAST MEDICAL HISTORY:  1. Congestive heart failure with an EF of 20% confirmed by cardiac      catheterization.  An echocardiogram with history of nonischemic      cardiomyopathy.  2. History of poorly controlled hypertension.  3. Trivial valve mitral regurgitation.  4. Left bundle branch block.   REVIEW OF SYSTEMS:  As stated above.   CURRENT MEDICATIONS:  1. Altace 2.5 mg daily.  2. Kay-Ciel 20 mEq daily.  3. Aspirin 81 mg daily.  4. Lasix 80 mg daily.  5. Carvedilol 6.25 mg b.i.d.   PHYSICAL  EXAMINATION:  Weight 191.08 pounds, patient's weight is up 11  pounds from May.  Blood pressure 122/76 with a heart rate of 75.  Ms.  Moyano is in no acute distress, she is her usual pleasant self.  No  jugular vein distention at 45 degree angle.  LUNGS:  Clear to auscultation bilaterally.  CARDIOVASCULAR:  Reveals an S1-S2 with a 2/6 systolic ejection murmur  noted.  ABDOMEN:  Soft, nontender, positive bowel sounds.  LOWER EXTREMITIES:  Without clubbing, cyanosis or edema.  NEUROLOGICALLY:  Patient alert and oriented x3.   IMPRESSION:  Heart failure secondary to nonischemic cardiomyopathy with  ejection fraction currently 20% without signs of volume overload at this  time.  Patient's increased weight is most likely secondary to dietary  indiscretion.  Have reinforced 4 gram sodium diet with patient and  granddaughter.  I am going to have her increase her Coreg to 12.5 mg  b.i.d. and obtain blood work today.  Rescheduled patient  for her 2D  echocardiogram.   PRIMARY CARDIOLOGIST:  Dr. Myrtis Ser.      Dorian Pod, ACNP  Electronically Signed      Bevelyn Buckles. Bensimhon, MD  Electronically Signed   MB/MedQ  DD: 11/29/2006  DT: 11/29/2006  Job #: [id number]

## 2010-09-08 NOTE — Assessment & Plan Note (Signed)
[facility]                          CHRONIC HEART FAILURE NOTE   Michele Caldwell, Michele Caldwell                         MRN:          [id number]  DATE:01/12/2007                            DOB:          Nov 28, 1934    Michele Caldwell returns today for followup of her congestive heart failure  which is secondary to nonischemic cardiomyopathy with an EF currently 10-  15% by echocardiogram in August 2008.  EF previously 20% by cardiac  catheterization.  Michele Caldwell heart failure is felt to be predominantly  secondary to hypertension.  When I last saw Michele Caldwell after her  echocardiogram, I had started her on spironolactone 25 mg daily and  stopped her potassium.  I also titrated her Carvedilol to 18.37 mg  b.i.d. which Michele Caldwell has tolerated without any lightheadedness or  dizziness.  She states compliance with her medication and no problems  suggestive of volume overload.   PAST MEDICAL HISTORY:  1. Congestive heart failure with EF currently 10-15% by      echocardiogram, down from 20% by cardiac catheterization.  2. Status post cardiac catheterization, March 2008, showing normal      coronary angiography.  3. History of poorly controlled hypertension.  4. Trivial mitral valve regurgitation.  5. Left bundle branch block.   REVIEW OF SYSTEMS:  As stated above.   CURRENT MEDICATIONS:  1. Altace 2.5 mg daily.  2. Lasix 80 mg daily.  3. Spironolactone 25 mg daily.  4. Carvedilol 18.375 mg daily.   PHYSICAL EXAMINATION:  VITAL SIGNS:  Weight 192, blood pressure 114/70  with a heart rate of 68.  GENERAL:  Michele Caldwell is in no acute distress.  No signs of jugular  venous distention at 45 degree angle.  LUNGS:  Clear to auscultation bilaterally.  CARDIOVASCULAR:  Reveals an S1 and S2 with a 2/6 systolic ejection  murmur.  ABDOMEN:  Soft, nontender.  Positive bowel sounds.  LOWER EXTREMITIES:  Without clubbing, cyanosis, or edema.  NEUROLOGIC:  The patient is  alert and oriented x3.   IMPRESSION:  Congestive heart failure without signs of volume overload  at this time with an ejection fraction currently 10-15%.   The patient's Carvedilol will be increased to 25 mg p.o. b.i.d.  I am scheduling her for a CPX test for further evaluation of her heart  failure, at which point I will  refer the patient back to Dr. Ladona Ridgel for consideration of ICD therapy.  The patient will also need to follow up with Dr. Myrtis Ser her primary  cardiologist.      Dorian Pod, ACNP  Electronically Signed      Bevelyn Buckles. Bensimhon, MD  Electronically Signed   MB/MedQ  DD: 01/12/2007  DT: 01/12/2007  Job #: [id number]   cc:   Audrea Muscat, MD

## 2010-09-08 NOTE — Assessment & Plan Note (Signed)
[facility]                          CHRONIC HEART FAILURE NOTE   Michele Caldwell, Michele Caldwell                         MRN:          [id number]  DATE:06/21/2007                            DOB:          1935-02-14    PRIMARY CARDIOLOGIST:  Dr. Jerral Bonito.   PRIMARY CARE:  Dr. Katrinka Michele Caldwell at [facility] in [place].   Ms. Michele Caldwell returns today for follow-up of her congestive heart failure  which is secondary to nonischemic cardiomyopathy with EF currently 10-  15% by echocardiogram status post recent hospitalization/discharge for  implantation of a [employer] single chamber cardioverter  defibrillator.  Ms. Michele Caldwell did quite well with the procedure with no  complications.  Followed up in the pacemaker clinic on February 8.  During hospitalization, her ACE inhibitor was placed on hold secondary  to increase BUN and creatinine and hypotension.  BUN and creatinine at  that time were 74 and 2.24.  Follow-up blood work obtained on February  18:  Potassium 4.1, BUN 29, creatinine down to 1.6.  Ms. Michele Caldwell states  she has been doing quite well since she was discharged home.  No  problems around ICD site.  Denies any peripheral edema, orthopnea, PND,  presyncope, syncope, lightheadedness, dizziness, palpitations or firing  from her device.  She is accompanied by her granddaughter today.   PAST MEDICAL HISTORY:  1. Congestive heart failure secondary to nonischemic cardiomyopathy      with EF currently 10-15% by echocardiogram.  2. Normal coronaries by cardiac catheterization.  3. History of poorly controlled hypertension.  4. Left bundle-branch block.  5. History of medical noncompliance.  6. Status post recent implantation of ICD.  7. Seasonal allergies.  8. Elevated BUN and creatinine during recent hospitalization requiring      discontinuation of ACE inhibitor.   REVIEW OF SYSTEMS:  As stated above, otherwise negative.   CURRENT MEDICATIONS:  1. Aspirin 81 mg daily.  2. Lasix 80 mg daily.  3. Spironolactone 12.5 mg daily.  4. Carvedilol 25 mg b.i.d.   PHYSICAL EXAMINATION:  Weight 195 pounds, blood pressure 105/65 with a  heart rate of 67.  Ms. Michele Caldwell is in no acute distress.  She is her usual  pleasant self.  No signs of jugular vein distention at 45 degree angle.  LUNGS:  Clear to auscultation.  CHEST:  She has a well-healed scar to her left chest area status post  ICD implant  CARDIOVASCULAR EXAM:  Reveals S1-S2.  Regular rate and rhythm, 2/6  systolic ejection murmur noted.  ABDOMEN:  Soft, nontender, positive bowel sounds, obese.  LOWER EXTREMITIES:  Without clubbing, cyanosis or edema.  NEUROLOGICAL:  Alert and oriented x3.   IMPRESSION:  Congestive heart failure secondary to nonischemic  cardiomyopathy with ejection fraction currently 10-15%.  Patient at full  dose of beta blocker therapy.  Will attempt to resume ACE inhibitor next  office visit or will increase it over the next few weeks.  The patient's  granddaughter is going to check her blood pressure sporadically and call  me and let me know what  her numbers are running and will resume Altace  based on blood pressure readings.  Will need follow-up BMET once  initiated for renal status.      Dorian Pod, ACNP  Electronically Signed      Rollene Rotunda, MD, [facility]  Electronically Signed   MB/MedQ  DD: 06/21/2007  DT: 06/22/2007  Job #: [id number]   cc:   Audrea Muscat, M.D.

## 2010-09-08 NOTE — Assessment & Plan Note (Signed)
[facility]                          CHRONIC HEART FAILURE NOTE   Michele Caldwell, Michele Caldwell                         MRN:          [id number]  DATE:10/24/2007                            DOB:          [birth year]    PRIMARY CARDIOLOGIST:  Luis Abed, MD, [facility]   PRIMARY CARE DOCTOR:  Dr. Katrinka Blazing at [facility]   ELECTROPHYSIOLOGIST:  Duke Salvia, MD, [facility] returns today for further followup of her congestive heart failure  which is secondary to nonischemic cardiomyopathy.  I last saw her in  April 2009 at which time she was doing quite well.  I started her back  on her ACE inhibitor at that time, and she was to follow up with Dr.  Graciela Husbands for routine cardiology visit and repeat blood work.  Michele Caldwell had her  blood drawn on Aug 30, 2007; potassium 4.4, BUN 14, and creatinine 1.5.  I decreased her Lasix to 40 mg daily.  Michele Caldwell was instructed to decrease  the Lasix to 40, but she returns today and states she is still taking 80  daily.  She saw Dr. Graciela Husbands for EP visit also in May 2009 for device  check.  Everything was remained the same at that time.  Michele Caldwell continues  to remain fairly active.  She does her own housework, cooking, and  cleaning.  She walks at the [facility] in [place] at  least 1 or 2 times a week.  She gets out and about with her girlfriend  without any problems.  She had seafood last week.  We have talked about  her preference for fast food in the past.  She states she has cut back  on the [employer] just on Sundays, but lately she has been eating more  baked fish when she eats out instead of the [employer] chicken.  She  denies any symptoms suggestive of volume overload.  She denies any  orthopnea, PND, or any problems from her device.   PAST MEDICAL HISTORY:  1. Congestive heart failure secondary to nonischemic cardiomyopathy      with an EF 10-15% by echocardiogram; most recent echo done in  January 2009.  2. Status post implantation of an ICD.  The patient has a Designer, television/film set managed by Dr. Graciela Husbands.  3. Left bundle-branch block.  4. Chronic renal insufficiency, creatinine peaked to 2.25 earlier this      year, baseline appears to be around 1.6.  5. History of medical noncompliance.  6. History of poorly-controlled hypertension.  7. Previous catheterization showing normal coronary anatomy.   REVIEW OF SYSTEMS:  As stated above, otherwise negative.   CURRENT MEDICATIONS:  1. Aspirin 81 mg daily.  2. Lasix should be 40 mg daily; Neola is still taking 80.  3. Spironolactone 25 mg half a tablet daily.  4. Carvedilol 25 mg b.i.d.  5. Altace 1.25 mg daily.   PHYSICAL EXAMINATION:  VITAL SIGNS:  Weight 195 pounds, blood pressure  107/63 with a heart rate of 69.  GENERAL:  Naydeline is in no acute distress.  No signs of jugular vein  distention at 45-degree angle.  LUNGS:  Clear to auscultation bilaterally.  ABDOMEN:  Soft and nontender.  Positive bowel sounds.  EXTREMITIES:  Lower extremities without clubbing, cyanosis, or signs of  edema.  NEUROLOGIC:  Alert and oriented x3.  Michele Caldwell is in her usual pleasant  self.   IMPRESSION:  Congestive heart failure secondary to nonischemic  cardiomyopathy in the setting of previous medical and diet/medication  noncompliance with an ejection fraction currently 15% status post  implantable cardioverter-defibrillator implant.  Ms. Flegal has done  quite well in her compliance over the last 6 months without signs of  volume overload.  At this time, once again we will ask her to decrease  her Lasix to 40 mg a day and use the other 40 mg p.r.n. if needed.  We  will see her back in 3 months at which time, she will need blood work;  and she is due to follow up with Dr. Graciela Husbands in 3 months also for device  check.      Dorian Pod, ACNP  Electronically Signed      Rollene Rotunda, MD, [facility]  Electronically Signed    MB/MedQ  DD: 10/24/2007  DT: 10/25/2007  Job #: [id number]   cc:   Dr. Katrinka Blazing

## 2010-09-08 NOTE — Letter (Signed)
May 19, 2007    Dorian Pod, ACNP  [phone], [place] [address]   RE:  CLAUDINA, OLIPHANT  MRN:  [id number]  /  DOB:  1934-08-30   Dear Marcelino Duster:   It was a pleasure seeing Latosha Gaylord with her daughter and  granddaughter concerning ICD implantation.  She has nonischemic  cardiomyopathy identified in the Spring when she presented with severe  congestive heart failure.  She underwent diuresis, medication up  titration and has done well.  At this point she can climb a flight of  stairs, she can out walk her daughter.  She has had no problems with  syncope or palpitations.  She does not have nocturnal dyspnea or  peripheral edema or orthopnea.   PAST MEDICAL HISTORY:  In addition to the above, is notable for  allergies.   She does not have a prior surgical history.   CURRENT MEDICATIONS:  1. Carvedilol 25 b.i.d.  2. Aldactone 25.  3. Lasix 80.  4. Aspirin.  5. Ramipril 2.5.   SHE HAS NO KNOWN DRUG ALLERGIES.   SOCIAL HISTORY:  She is married, she has 4 children.  She does not use  cigarettes, alcohol or recreational drugs.   EXAMINATION:  She is an elderly African American female appearing her  stated age of 63.  Her blood pressure is 108/60 and her pulse was 70,  her weight was 190.  HEENT:  Demonstrated no drfits or xanthomata.  NECK:  Veins were 7 cm with positive hepatojugular reflexes, the  carotids were brisk and full bilaterally with out bruits.  BACK:  Without kyphosis or scoliosis.  LUNGS:  Clear.  HEART:  Sounds were displaced but regular.  ABDOMEN:  Soft with active bowel sounds without midline pulsation or  hepatomegaly.  Femoral pulses were 2+, distal pulses were intact.  There was no  clubbing, cyanosis or edema.  NEUROLOGICAL:  Exam was grossly normal.  SKIN:  Warm and dry.   His electrocardiogram demonstrated sinus rhythm with left bundle branch  block.   IMPRESSION:  1. Nonischemic cardiomyopathy.  2. Class I to II congestive  heart failure.  3. Left bundle branch block.   Marcelino Duster, after a lengthy discussion with Ms. Dardis and her family and  going over the procedure and risks rather exhaustively, she is agreeable  to proceeding was ICD implantation.  We discussed the potential benefits  as well as the potential risks.  Given her functional status not  withstanding her left bundle branch block I do not see an indication to  proceed with CRT implantation.   Thank you very much for the consultation.    Sincerely,      Duke Salvia, MD, [facility]  Electronically Signed    SCK/MedQ  DD: 05/19/2007  DT: 05/20/2007  Job #: [id number]   CC:    Health Clinic

## 2010-09-08 NOTE — Assessment & Plan Note (Signed)
[facility]                          CHRONIC HEART FAILURE NOTE   Michele Caldwell, Michele Caldwell                         MRN:          [id number]  DATE:08/18/2007                            DOB:          05/26/1934    PRIMARY CARDIOLOGIST:  Luis Abed, MD, [facility].   PRIMARY CARE PHYSICIAN:  Dr. Katrinka Blazing at [facility] in [place].   EP PHYSICIAN:  Duke Salvia, MD, [facility]   Michele Caldwell returns today for followup of her congestive heart failure  which is secondary to nonischemic cardiomyopathy with an EF currently 10-  15% by echocardiogram status post recent hospitalization for  implantation of a [employer] single chamber ICD.  Michele Caldwell has  done quite well since her device was put in earlier this year.  She has  an appointment with Dr. Graciela Husbands coming up soon.  She states she has been  feeling really good, denies any symptoms suggestive of volume overload,  states compliance with medication, denies any lightheadedness,  dizziness, presyncope, syncopal episodes or firing from her device.  She  is accompanied by her granddaughter today.  In the past I had stopped  Mr. Fikes ACE inhibitor secondary to hypotension.  I also have had  difficulty titrating her beta-blocker, however, the last few months she  has tolerated full doses of carvedilol without any problems.   PAST MEDICAL HISTORY:  1. Includes congestive heart failure secondary to nonischemic      cardiomyopathy with EF 10-15%.  2. Status post recent hospitalization for implantation of ICD.  3. Normal coronaries by catheterization.  4. History of poorly controlled hypertension.  5. Left bundle branch block.  6. History of medical noncompliance.  7. Seasonal allergies.  8. History of mild renal insufficiency while on ACE inhibitor.   Most recent blood work done on February 18, BUN 29, creatinine 1.6,  previously had been 74 and 2.24.   REVIEW OF SYSTEMS:  As stated above,  otherwise negative.   CURRENT MEDICATIONS:  1. Include aspirin 81 mg daily.  2. Lasix 80 mg daily.  3. Spironolactone 12.5 mg daily.  4. Carvedilol 25 mg b.i.d.   PHYSICAL EXAMINATION:  VITAL SIGNS:  Weight 193, blood pressure 106/62,  heart rate 74.  GENERAL:  Michele Caldwell is in no acute distress.  NECK:  No signs of jugular vein distention at 45 degree angle.  LUNGS:  Clear to auscultation.  CARDIOVASCULAR:  Exam reveals an S1 and S2, 2/6 systolic ejection murmur  noted.  ABDOMEN:  Soft, nontender, obese, positive bowel sounds.  LOWER EXTREMITIES:  Without clubbing, cyanosis or edema.  NEUROLOGICAL:  Alert and oriented x3.   IMPRESSION:  Congestive heart failure secondary to nonischemic  cardiomyopathy with EF 10-15%.  The patient is tolerating a full dose  beta-blocker at this time.  Will resume her ACE inhibitor.  I am going  to start her Altace back at 1.25 mg daily and check blood work on her  next week.  Will need to follow renal function closely.  The patient has  a followup appointment with Dr.  Graciela Husbands status post ICD.      Dorian Pod, ACNP  Electronically Signed      Rollene Rotunda, MD, [facility]  Electronically Signed   MB/MedQ  DD: 08/18/2007  DT: 08/18/2007  Job #: [id number]   cc:   Katrinka Blazing, Dr

## 2010-09-08 NOTE — Discharge Summary (Signed)
Michele Caldwell, Michele Caldwell                ACCOUNT NO.:  [id number]   MEDICAL RECORD NO.:  [id number]          PATIENT TYPE:  INP   LOCATION:  2921                         FACILITY:  [facility]   PHYSICIAN:  Duke Salvia, MD, FACCDATE OF BIRTH:  [birth year]   DATE OF ADMISSION:  05/30/2007  DATE OF DISCHARGE:  05/31/2007                               DISCHARGE SUMMARY   ADDENDUM   Elevated creatinine 2.24 on admission with a repeat dose pending at the  time of this dictation.  I am holding her ramipril 2.5 mg until she  presents to the ICD clinic which is scheduled for February 18.  She will  get a BMET at that time.  Then she will see Dorian Pod, NP, on  February 25, at 11 a.m., to reassess whether to restart that based on  the BMET taken the week prior.      [name], [place]      Duke Salvia, MD, [facility]  Electronically Signed    GM/MEDQ  D:  05/31/2007  T:  06/01/2007  Job:  [id number]   cc:   [facility]/Dr. Milda Smart, ACNP  Bevelyn Buckles. Bensimhon, MD

## 2010-09-08 NOTE — Discharge Summary (Signed)
NAMEBARBERA, PERRITT                ACCOUNT NO.:  [id number]   MEDICAL RECORD NO.:  [id number]          PATIENT TYPE:  INP   LOCATION:  2921                         FACILITY:  [facility]   PHYSICIAN:  Duke Salvia, MD, FACCDATE OF BIRTH:  [birth year]   DATE OF ADMISSION:  05/30/2007  DATE OF DISCHARGE:  05/31/2007                               DISCHARGE SUMMARY   This patient has no known drug allergies.   Time for this dictation, exam and discussion greater than 35 minutes,   FINAL DIAGNOSIS:  Discharge postprocedure day #1 after implantation of a  [employer] single-chamber cardioverter-defibrillator.  The Confient is a high-energy device necessitated due to elevated  thresholds requiring increased energy to terminate ventricular  fibrillation.   SECONDARY DIAGNOSES:  1. Severe nonischemic cardiomyopathy.      a.     Ejection fraction 10-15% by echocardiogram.  2. Left bundle branch block.  3. New York Heart Association class II chronic systolic congestive      heart failure  4. Hypotension postprocedurally, medication adjustment taken.  5. Renal insufficiency with creatinine 2.24 on admission.  A follow-up      basic metabolic panel is pending.  6. According to previous notes, the patient has poorly-controlled      hypertension.   PROCEDURE:  May 30, 2007, implant of a [employer])  single-chamber cardioverter defibrillator with some difficulty achieving  termination of ventricular fibrillation without implant of a high-energy  device.  Currently the patient requires a 37-joules shock to terminate  ventricular fibrillation.   BRIEF HISTORY:  Michele Caldwell is a 76 year old female.  She has a  nonischemic cardiomyopathy, ejection fraction is severely compromised at  10-15%.  This was first identified the spring of 2008 when she presented  with severe acute on chronic congestive heart failure.  She underwent  successful IV diuresis with  up-titration of her medication.  She has  done well.  At this point she can climb a flight of stairs.  She can  walk with her daughter accompanying her shopping.  The patient has no  problems with a history of syncope or palpitation.  The neck veins were  7 cm with positive hepatojugular reflux on examination.   After a lengthy discussion with Ms. Peruski and her family, the risks  have been identified.  The patient is agreeable to implant of an ICD.  Her functional status does not present an indication for CRT she  implantation at this time.   HOSPITAL COURSE:  The patient presents electively February 3.  She  underwent implantation of [employer] device without  complication.  There was some trouble making sure that she could have  proper high-energy termination of atrial fibrillation.  She requires,  once again, a 37-joule shock for this.  The patient has been hypotensive  postprocedurally although is not expressing any problem with dizziness  or shortness of breath.  Her morning Coreg has been on hold, her  Aldactone has been cut in half, and she will go home this afternoon  after lunch.   DISCHARGE MEDICATIONS:  1. Aldactone 25 mg 1/2 tablet daily.  This is a new dose.  2. Altace 2.5 mg daily.  3. Enteric-coated aspirin 81 mg daily.  4. Lasix 80 mg daily.  5. Coreg 25 mg twice daily.   She is asked to hold off on taking Coreg until Friday, February 6, and  then restart.   Follow-up appointments are at [facility], [address]:  1. ICD clinic Wednesday, February 8, at 9 o'clock  2. To see Dr. Graciela Husbands Tuesday, May 5, at 2:30.   She is asked to weigh herself daily, this is very important, to call Dr.  Gala Romney at [facility] for help if she starts gaining weight or  if she gets short-winded.   LAB STUDIES THIS ADMIT:  Serum electrolytes on February 3:  Sodium 133,  potassium 4.9, chloride 101, carbonate 23, glucose 114, BUN 74,   creatinine 2.24.  Because of this, we will also hold her Altace.  We  will schedule a follow-up at [facility] when she comes to the office to see  Gunnar Fusi and [facility], and also we will schedule a follow-up appointment with  the congestive heart failure clinic in about 3 weeks.  At that time  reassessment of her blood pressure medications will be undertaken,  partially based on her BMET at the incision check.  Pro time this  admission 14.3, INR 1.1, PTT is 36.  White cells 9.1, hemoglobin 10.7,  hematocrit 32.4 and platelets 278.   She will have, as mentioned, a follow-up at the congestive heart failure  clinic, which will be added as soon as I hang up this phone.      Michele Caldwell, [place]      Duke Salvia, MD, [facility]  Electronically Signed    GM/MEDQ  D:  05/31/2007  T:  06/01/2007  Job:  [id number]   cc:   Dr. Katrinka Blazing

## 2010-09-08 NOTE — Assessment & Plan Note (Signed)
[facility]                          CHRONIC HEART FAILURE NOTE   DEANDRA, GOERING                         MRN:          [id number]  DATE:09/05/2006                            DOB:          08/22/1934    Ms. Frenette returns today for followup of congestive heart failure which  is secondary to non-ischemic cardiomyopathy with EF currently of 20% by  cardiac catheterization. Heart failure felt to be predominantly  secondary to hypertension. Ms. Dileo states that she has been doing  quite well since I last saw her in April, tolerating her medications  without problems. Tolerating further titration of beta-blocker without  lightheadedness, dizziness, pre-syncope or syncopal episodes. Has been  compliant with her restricted sodium diet. She states that she feels  better than she has in years. She states that she did not realize how  sick she was or how affected her breathing was. She states that she has  been spending more time with her grandson and enjoys playing or watching  him where she could not do this before her hospitalization.   PAST MEDICAL HISTORY:  1. Congestive heart failure with an EF of 20% confirmed by cardiac      catheterization, echocardiogram in the setting of non-ischemic      cardiomyopathy.  2. Poorly controlled hypertension.  3. Trivial mitral valve regurgitation.  4. Left bundle branch block.   CURRENT MEDICATIONS:  1. Altace 2.5.  2. KCl 20 daily.  3. Aspirin 81.  4. Carvedilol 6.25 b.i.d.  5. Lasix 40 mg daily.   REVIEW OF SYSTEMS:  As stated above.   PHYSICAL EXAMINATION:  Weight 180, blood pressure 129/77 with a pulse of  83. Ms. Storts is in no acute distress.  No jugular vein distention at 45 degree angle.  LUNGS:  Clear to auscultation.  CARDIOVASCULAR: Reveals an S1 and S2 with a 2/6 systolic ejection  murmur.  ABDOMEN: Soft and nontender. Positive bowel sounds.  Lower extremities without clubbing, cyanosis or  edema.   IMPRESSION:  Stable heart failure. At this time, no signs of volume  overload. Class II New York Heart Association. Ms. Wardrop has tolerated  the decreased Lasix dose well and the increase beta-blocker dose well. I  am going to have her increase her evening dose to 9.375 mg  in the evening, continue 6.25 in the morning for four days. If the  patient tolerates this dose, will also have her increase the morning  dose to 9.375  and will check lab work today.      Dorian Pod, ACNP       Rollene Rotunda, MD, [facility]    MB/MedQ  DD: 09/05/2006  DT: 09/05/2006  Job #: [id number]   cc:   Dr. Lyla Son, MD, [facility]

## 2010-09-08 NOTE — Assessment & Plan Note (Signed)
[facility]                          CHRONIC HEART FAILURE NOTE   TOSHIBA, NULL                         MRN:          [id number]  DATE:02/14/2007                            DOB:          19-Sep-1934    PRIMARY CARDIOLOGIST:  Dr. Willa Rough.   PRIMARY CARE PHYSICIAN:  Dr. Katrinka Blazing at [facility] in [place].   Ms. Maeda returns today for further followup of her congestive heart  failure, which is secondary to nonischemic cardiomyopathy with an EF  currently 10-15% by echocardiogram.  EF previously 20% by  catheterization.  Ms. Rayson heart failure is felt to be predominantly  secondary to hypertension.  I have been titrating Ms. Lengyel's  medications.  She has tolerated without problems.  Has had some dietary  indiscretion over the summer and gained some weight.  Was eating more  fast foods than usual.  Otherwise, she has done quite well.  She is  complaining of some cough related to sinus drainage, and has questioned  which over-the-counter medication she can try.   PAST MEDICAL HISTORY:  1. Congestive heart failure with an EF currently of 10-15% by      echocardiogram.  2. Status post cardiac catheterization showing normal coronaries.  3. History of poorly-controlled hypertension.  4. Trivial mitral valve regurgitation.  5. Left bundle branch block.  6. Obesity.   REVIEW OF SYSTEMS:  As stated above.   CURRENT MEDICATIONS:  1. Altace 2.5.  2. Aspirin 81.  3. Lasix 80.  4. Spironolactone 25 daily.  5. Carvedilol 25 b.i.d.   PHYSICAL EXAM:  Weight 192, blood pressure 105/64 with a heart rate of  69.  Ms. Drumgoole is in no acute distress.  NECK:  Supple without signs of jugular venous distension at 45 degree  angle.  LUNGS:  Clear to auscultation.  CARDIOVASCULAR:  Reveals an S1, S2 with 2/6 systolic ejection murmur.  ABDOMEN:  Soft and nontender with positive bowel sounds.  Obese.  LOWER EXTREMITIES:  Without cyanosis,  clubbing, or edema.  NEUROLOGIC:  Alert and oriented x3.   IMPRESSION:  Congestive heart failure secondary to nonischemic  cardiomyopathy with an ejection fraction of 10-15%.  I had arranged for  Ms. Provencher to undergo a CPX test prior to this appointment.  However,  she states she had a cold last week and she cancelled the appointment,  and had to reschedule it.  Will continue current medications.  I have  recommended that she try claritin or Zyrtec over-the-counter for her  allergies.  I have also asked her to start on fish oil 1000 mg daily,  and I will see her back after her CPX test.  We will need to arrange for  a routine cardiology visit with Dr. Myrtis Ser for beginning of the year.      Dorian Pod, ACNP  Electronically Signed      Bevelyn Buckles. Bensimhon, MD  Electronically Signed   MB/MedQ  DD: 02/14/2007  DT: 02/15/2007  Job #: [id number]   cc:   Audrea Muscat, MD

## 2010-09-08 NOTE — Assessment & Plan Note (Signed)
[facility]                         ELECTROPHYSIOLOGY OFFICE NOTE   Michele Caldwell, Michele Caldwell                         MRN:          [id number]  DATE:08/29/2007                            DOB:          [birth year]    Michele Caldwell is seen in follow-up for an ICD implanted a couple of months  ago for congestive heart failure in the setting of nonischemic heart  disease with no left ventricular lead placement despite left bundle  branch block.  The reasons for this are not yet clear.   She also had problems with renal insufficiency so that her ACE inhibitor  was discontinued for a zenith creatinine of 2.25. It had then gone back  down to about 1.6 checked most recently in mid April. Altace was started  in the congestive heart failure clinic a couple of weeks ago.   Her other medications include carvedilol 25 b.i.d., spirolactone 12.5,  Lasix and aspirin.   PHYSICAL EXAMINATION:  Her blood pressure is 127/71 with a pulse of 73.  Her lungs were clear.  Her neck veins were flat.  Her heart sounds were regular.  Extremities had no edema.   Interrogation of her device demonstrated a Confient with an R-wave of  25, impedance of 562 with threshold 0.4 volts at 0.5 milliseconds. High  voltage impedance was 48 ohms.   IMPRESSION:  1. Nonischemic cardiomyopathy.  2. Class 1-2 congestive heart failure.  3. Left bundle branch block.  4. Renal insufficiency.  5. High DFTs requiring explantation of an old device and implantation      of a new device at the time of initial implant.   Michele Caldwell is stable from a tachyarrhythmia point of view.  We will need  to check a BMET today to assess the effects of the recent readdition of  her Altace to her medical regime.   She will follow-up with Dorian Pod in the heart failure clinic in  the next 6-8 weeks.   We will see her again in the device clinic in 3 months.     Duke Salvia, MD, [facility]  Electronically  Signed    SCK/MedQ  DD: 08/29/2007  DT: 08/29/2007  Job #: [id number]

## 2010-09-11 NOTE — Cardiovascular Report (Signed)
Michele Caldwell, Michele Caldwell                ACCOUNT NO.:  [id number]   MEDICAL RECORD NO.:  [id number]          PATIENT TYPE:  INP   LOCATION:  4702                         FACILITY:  [facility]   PHYSICIAN:  Michele R. Juanda Chance, MD, FACCDATE OF BIRTH:  1934-11-09   DATE OF PROCEDURE:  07/05/2006  DATE OF DISCHARGE:                            CARDIAC CATHETERIZATION   CLINICAL HISTORY:  Michele Caldwell is 76 years old and has a history of  hypertension.  She has been having symptoms of shortness of breath for  greater than 1 month and the day of admission she became severely short  of breath and was brought to [facility] ED by ambulance with acute  pulmonary edema.  She was close to intubation, but was managed with  BiPAP and IV Lasix and was transferred to [place] for further treatment and  further evaluation.  She was diuresed in the hospital and her symptoms  have improved.  She was seen by Dr. Myrtis Caldwell and he scheduled her for  evaluation of angiography today.   PROCEDURE:  Right heart catheterization was performed percutaneously via  the right femoral vein using a mini-sheath and Swan-Ganz femoral sheath  catheter.  Left heart catheterization was performed percutaneously via  the right femoral artery and arterial sheath and a 6-French piriform  coronary catheters.  A front wall arterial puncture was performed and  Omnipaque contrast was used.  This aortogram was performed to rule out  renal vascular causes for her hypertension.  She tolerated the procedure  well and left the laboratory in satisfactory condition.   RESULTS:  Left main coronary artery:  The left main coronary artery was  free of significant disease.   Left anterior descending artery:  The left anterior descending artery  gave rise to 4 diagonal branches and 2 septal perforators.  These and  the LAD proper were free of significant disease.   Circumflex artery:  The underlying circumflex artery gave rise to a  large ramus marginal  branch, posterolateral branch, and a posterior  descending branch.  These vessels were free of significant disease.  All  these vessels were extremely large and were shot on 9 inch in a very  small woman.   Right coronary artery:  Nondominant vessel which supplied only right  ventricular branches and these were free of significant disease.   Left ventriculogram:  The left ventriculogram was performed in the RAO  projection and showed global hypokinesis with an estimated ejection  fraction of 20%.   Distal aortogram:  The distal aortogram was performed which showed  patent renal arteries and showed no significant aortoiliac obstruction.   HEMODYNAMIC DATA:  The right pressure was 7 mean.  The pulmonary artery  pressure was 56/25, with a mean of 38.  The pulmonary aortic pressure  was 14 mean.  The left ventricular pressure was 101/21.  The aortic  pressure was 101/62 with a mean of 72.  The cardiac output/cardiac index  was 3.6/1.8 liters/minutes/meters2 x6.  Pulmonary artery saturation was  56%, and the aortic saturation was 90% on 4 liters of oxygen.   CONCLUSION:  1. Normal coronary angiography.  2. Severe nonischemic cardiomyopathy with an ejection fraction      estimated at 20% and a reduced cardiac index.   RECOMMENDATIONS:  The patient has a severe nonischemic cardiomyopathy.  Based on the hypertrophy of the coronary vessels, I suspect this is long-  standing.  Her volume status after treatment now appears fairly optimal,  although she still does have a reduced cardiac index.  We will plan on  continued medical therapy.  If she remains class 3 to 4 in the hospital,  then we may consider cardiac resynchronization therapy and an ICD at the  same time.  I spoke with Dr. Myrtis Caldwell and Dr. Ladona Caldwell who will also see her  in consultation.      Michele Elvera Lennox Juanda Chance, MD, [facility]  Electronically Signed     BRB/MEDQ  D:  07/05/2006  T:  07/06/2006  Job:  [id number]   cc:   Michele Abed, MD,  [facility]  Michele Caldwell

## 2010-09-11 NOTE — H&P (Signed)
Michele Caldwell, Michele Caldwell                ACCOUNT NO.:  [id number]   MEDICAL RECORD NO.:  [id number]          PATIENT TYPE:  INP   LOCATION:  6529                         FACILITY:  [facility]   PHYSICIAN:  Gerrit Friends. Dietrich Pates, MD, FACCDATE OF BIRTH:  05-28-1934   DATE OF ADMISSION:  06/29/2006  DATE OF DISCHARGE:                              HISTORY & PHYSICAL   PRIMARY CARE PHYSICIAN:  The patient states she is followed by Dr. Katrinka Blazing  at [facility].  Further information not available at this  time.   HISTORY:  The patient is a 76 year old African-American female who  initially presented [facility] emergency room via EMS.  Apparently  the patient was at a beauty salon getting her hair done, had complained  of shortness of breath all day; however, around 3 p.m., as she was  leaving, she became unresponsive.  EMS was called.  Per EMS  documentation the patient was agonal respirations at that time.  She was  bagged per bag valve mask with a white frothy sputum.  BiPAP was  initiated.  Saturations initially 86% on room air, increased to 91% on  BiPAP.  The patient received 160 mg total of furosemide IV at [facility] per EMS report.  She diuresed 1 liter and then another 1 liter  prior to our assessment.  She was transported to [facility] emergency  room for further evaluation.  Per patient's granddaughter, the patient  has had increased shortness of breath times 1 month with peripheral  edema.  The patient denied any other symptoms.   PAST MEDICAL HISTORY:  Includes hypertension.  She states she was  diagnosed 1 year ago.  Denies any hospitalizations, any surgeries.  States she and normal vaginal deliveries of her children.  Denied any  history of diabetes.  Does not know her cholesterol status.  States she  only goes to the doctor when she needs to get a refill on her blood  pressure medication.   ALLERGIES:  NO KNOWN DRUG ALLERGIES.   MEDICATIONS:  Include Norvasc 5 mg p.o.  daily, Aleve and Claritin p.r.n.   SOCIAL HISTORY:  She lives in [place] with her husband.  She denies  ever using any tobacco, alcohol, drugs or herbal medications.   FAMILY HISTORY:  Sketchy, apparently mother had a history of heart  failure.  Father deceased from some type of cancer, unknown history of  any heart disease in an siblings.   REVIEW OF SYSTEMS:  Positive for shortness of breath, dyspnea on  exertion, orthopnea and edema, cough.  The patient states she is  sleeping on 2 pillows for the past month, otherwise denies any symptoms.   PHYSICAL EXAMINATION:  VITAL SIGNS:  Afebrile, heart rate 89,  respirations 22, blood pressure 110/59.  The patient is currently  saturating 100% on BiPAP.  GENERAL:  She is in no acute distress.  She is talking without dyspnea  with BiPAP intact.  HEENT:  Normocephalic, atraumatic.  Pupils equal, round and reactive to  light. Sclerae clear.  NECK:  Supple.  JVD at 8 to 10  cm at a 45-degree angle.  LUNGS:  Distant breath sounds bilateral bases, right being greater than  left.  CARDIOVASCULAR:  Reveals an S1 and S2, positive S3.  ABDOMEN:  Soft, nontender.  Positive bowel sounds.  EXTREMITIES:  Lower extremities with Chronic signs of chronic venous  insufficiency and nonpitting edema.  NEUROLOGIC:  The patient is alert and oriented x3.   X-RAYS/EKG/DATA:  Chest x-ray pending . EKG sinus rhythm with LVH rate  of 86.  Lab work pending.   Dr. Putnam Bing in to examine and assess the patient.  Records from  [facility] reviewed.  Pulmonary edema nearly requiring intubation.  Subacute onset over the past few weeks.  Almost certainly cardiomyopathy  does not sound if hypertension has been severe enough to account for LV  failure.  Continue diuresis, add ACE inhibitor, echocardiogram, cardiac  markers.  Cardiac catheterization will probably be required if patient  agrees.      Dorian Pod, ACNP      Gerrit Friends. Dietrich Pates,  MD, [facility]  Electronically Signed    MB/MEDQ  D:  06/29/2006  T:  06/30/2006  Job:  [id number]

## 2010-09-11 NOTE — Consult Note (Signed)
NAMELEVY, WELLMAN                ACCOUNT NO.:  [id number]   MEDICAL RECORD NO.:  [id number]          PATIENT TYPE:  INP   LOCATION:  4702                         FACILITY:  [facility]   PHYSICIAN:  Doylene Canning. Ladona Ridgel, MD    DATE OF BIRTH:  [birth year]   DATE OF CONSULTATION:  07/05/2006  DATE OF DISCHARGE:  07/07/2006                                 CONSULTATION   INDICATION FOR CONSULTATION:  Evaluation of nonischemic cardiomyopathy  and congestive heart failure for possible ICD implantation.   HISTORY OF PRESENT ILLNESS:  The patient is a 76 year old woman who  resides in [place], [place].  Over the last several months, she  has had increasing shortness of breath, but apparently did not tell any  of her family members.  She presented to the hospital on March 5 with  decompensated congestive heart failure.  She was placed on BiPAP and  given IV Lasix and transferred to [facility]   HOSPITAL COURSE:  The patient has stabilized.  She ruled out for MI.  Her heart failure symptoms improved.  She has had fairly soft blood  pressures, typically in the 90-100 range, after medical therapy has been  initiated.  Her dyspnea has improved.  She underwent catheterization by  Dr. Juanda Chance which demonstrated no coronary artery disease and global LV  dysfunction with an EF of 20%.  Her cardiac index was measured as 1.8.  Her PA pressure was 56/25 with a wedge of 14.  She has been initiated on  ACE inhibitors (Altace 2.5 twice a day), aspirin, potassium  supplementation, Carvedilol 3.125 twice a day and Lasix, for which she  is still on the intravenous form.   FAMILY HISTORY:  The patient's mother has a history of heart failure.  Her father died of cancer.  There is no heart disease known in any of  her siblings.   SOCIAL HISTORY:  The patient lives in [place] with her husband.  She  denies tobacco or ethanol abuse.   ALLERGIES:  No known drug allergies.   REVIEW OF SYSTEMS:   notable for shortness of breath, dyspnea, orthopnea,  peripheral edema, chronic cough and PND.  She denies palpitations or  syncope.  Rest of review of systems was negative except as noted in the  HPI.   PHYSICAL EXAMINATION:  GENERAL:  She is a pleasant 76 year old woman in  no acute distress.  VITAL SIGNS:  Blood pressure was 100/75, pulse was 80 and regular,  respirations were 18, temperature 98.  HEENT:  Normocephalic and atraumatic.  Pupils equal and round.  Oropharynx moist.  Sclerae anicteric.  NECK:  Revealed 7 cm jugular distension.  There is no thyromegaly.  Trachea is midline.  Carotids were 2+ and symmetric.  LUNGS:  Clear bilaterally auscultation.  No wheezes, rales or rhonchi  today, except for mild basilar rale.  CARDIOVASCULAR:  Revealed a regular rate and rhythm with normal S1-S2.  There was a soft S3 gallop and there was a fairly prominent midsternal  left.  There was a soft systolic murmur at the left lower sternal  border.  ABDOMEN:  Soft, nontender, nondistended, no organomegaly.  Bowel sounds  were present.  No rebound or guarding.  EXTREMITIES:  Demonstrated trace peripheral edema bilaterally.  NEUROLOGIC:  Alert x3 nerves intact.  Strength was 5/5 and symmetric.   STUDIES:  EKG demonstrates sinus rhythm with left bundle branch block  and QRS duration of 170 milliseconds.   IMPRESSION:  1. Nonischemic cardiomyopathy.  2. Congestive heart failure.  3. Left bundle branch block.  4. Hypertension.  5. Obesity.   DISCUSSION:  At present, her heart failure symptoms have been present  for less than 3 months.  I have recommended that we maximize medical  therapy with ACE inhibitors and beta blockers and diuretics, and that we  repeat her 2-D echo in 3 months.  If she still has LV dysfunction, then  ICD implantation will be warranted.  If her heart failure remains class  III, then implantation of a Bi-V ICD would be most appropriate.  I  discussed the risks,  benefits, goals and expectations of this procedure.  We will follow her up in the office and repeat her echo in 3 months and  continue her present medical therapy.  If the patient would develop  heart failure symptoms that are class IV or a VT, then we would be much  more strongly in favor of proceeding initially with ICD implantation.      Doylene Canning. Ladona Ridgel, MD  Electronically Signed     GWT/MEDQ  D:  07/05/2006  T:  07/07/2006  Job:  [id number]   cc:   Gerrit Friends. Dietrich Pates, MD, [facility]  Bruce R. Juanda Chance, MD, [facility]

## 2010-09-11 NOTE — Assessment & Plan Note (Signed)
[facility]                          CHRONIC HEART FAILURE NOTE   VIDYA, BAMFORD                         MRN:          [id number]  DATE:08/04/2006                            DOB:          05-19-1934    Ms. Dauenhauer returns today for followup of her congestive heart failure  which is secondary to nonischemic cardiomyopathy with EF currently 20%  by cardiac catheterization.  Her heart failure is felt to be  predominantly secondary to hypertension.  I saw Ms. Pavlak as a new  patient a few weeks ago at which time I initiated her education and  titrated her evening dose of Carvedilol.  She tolerated this without  problems, denies any lightheadedness or dizziness.  She has been very  vigilant in diet compliance, to the point her granddaughter thinks she  carries it to the extreme at times, but she is determined not to end up  in acute pulmonary edema again.  She states she has been sleeping well,  denies any shortness of breath or dyspnea on exertion.  Still easily  fatigued at times, but otherwise states she feels much better.  She did  not realize how sick she was prior to her hospitalization.  She  underwent a cardiac catheterization during the recent hospitalization  that showed normal coronary angiography and an EF of 20%.  Her pulmonary  artery pressure was 56 over 25 with a mean of 38.  Ms. Pietro responded  well and has much improved since that time.   PAST MEDICAL HISTORY:  1. Recent hospitalization for acute pulmonary edema/congestive heart      failure, new onset, with an ejection fraction of 20% confirmed by      cardiac catheterization and echocardiogram in the setting of      nonischemic cardiomyopathy.  2. Poorly controlled hypertension in the past.  3. Status post echocardiogram showing trivial mitral valvular      regurgitation.  4. Left bundle branch block.   ALLERGIES:  NO KNOWN DRUG ALLERGIES.   MEDICATIONS:  1. Altace 2.5 mg  daily.  2. Lasix 80 mg daily.  3. Kay-Ciel 20 mEq daily.  4. Aspirin 81 mg daily.  5. Coreg 3.125 in the morning and 6.25 in the evening.   REVIEW OF SYSTEMS:  As stated above.   PHYSICAL EXAMINATION:  Weight 178, blood pressure 110/63 with a heart  rate of 84.  Ms. Ivanoff is in no acute distress.  No JVD at 45 degree angle.  LUNGS:  Clear to auscultation bilaterally, she has a very soft systolic  ejection murmur.  Otherwise S1 and S2 regular rate and rhythm.  ABDOMEN:  Soft, nontender, positive bowel sounds.  LOWER EXTREMITIES:  Without clubbing, cyanosis, or edema.  NEUROLOGICALLY:  Alert and oriented x3.   IMPRESSION:  Stable heart failure at this time, no signs of volume  overload.  Classification II per [place] Heart Association.  I am going  to increase her Coreg to 6.25 mg p.o. b.i.d., decrease her Lasix to 40.  See her back in one month, sooner if she has any  problems.      Dorian Pod, ACNP  Electronically Signed      Rollene Rotunda, MD, [facility]  Electronically Signed   MB/MedQ  DD: 08/04/2006  DT: 08/04/2006  Job #: [id number]   cc:   Dr. Katrinka Blazing

## 2010-09-11 NOTE — Assessment & Plan Note (Signed)
[facility]                          CHRONIC HEART FAILURE NOTE   Michele Caldwell, Michele Caldwell                         MRN:          [id number]  DATE:07/26/2006                            DOB:          1934/07/17    PRIMARY CARE PHYSICIAN:  Dr. Katrinka Blazing at [facility] in [place].   PRIMARY CARDIOLOGIST:  Luis Abed, MD.   Michele Caldwell is new to the Heart Failure Clinic.  She was recently  discharged from [facility] on July 07, 2006, for acute  systolic congestive heart failure secondary to nonischemic  cardiomyopathy with an EF of 20%.  She underwent a cardiac  catheterization at that time that showed normal coronaries, an ejection  fraction of 20% with global hypokinesis.  Michele Caldwell initial diagnosis  was respiratory distress.  Michele Caldwell is a 76 year old, African-American  female, who initially presented to [facility] Emergency Room via  EMS with complaints of shortness of breath.  Apparently she was getting  her hair done and became unresponsive, was found to be agonal by the  paramedics.  She was bagged per bag valve mask with a white, frothy  sputum, BiPAP was initiated.  She was diuresed at [facility] Emergency Room  with 160 mg of Furosemide and transferred to [facility] for further  evaluation.  She was seen initially by Dr. Dietrich Pates and myself, blood  pressure initially 110/59 when we evaluated patient.  Patient was  hospitalized, cardiac catheterization when stable.  Dr. Ladona Ridgel saw  patient in consultation for possible ICD implantation.  He recommended  maximizing medical therapy and repeating her echo in three months.  If  she still remained Class III, then consider implantation of a  biventricular ICD would be appropriate.  Michele Caldwell states she has done  well since she was discharged home.  She has become very observant of  the sodium in her diet.  She states compliance with her medications.  Her granddaughter  accompanies her today.  She states her grandmother has  been very, very prudent about her diet restrictions and fluid  measurements.  She denies orthopnea or PND, states she is sleeping  better than she has in years, denies any syncope or presyncopal  episodes, no chest discomfort, no palpitations.   PAST MEDICAL HISTORY:  1. Congestive heart failure secondary to a nonischemic cardiomyopathy;      ejection fraction currently 20% by cardiac catheterization.  2. Hypertension poorly controlled.  3. Normal coronary arteries by cardiac catheterization.   REVIEW OF SYSTEMS:  As stated above.   CURRENT MEDICATIONS:  1. Altace 2.5 mg daily.  2. Carvedilol 3.125 mg b.i.d.  3. Lasix 80 mg daily.  4. KCL 20 mEq daily.  5. Aspirin 81 mg daily.   No known drug allergies.   PHYSICAL EXAMINATION:  VITAL SIGNS:  Weight 178 pounds, blood pressure  128/74 with a pulse of 80.  GENERAL:  Michele Caldwell is in no acute distress, a very pleasant,  cooperative female.  NECK:  JVD 7 to 8 cm at a 45-degree angle.  LUNGS:  Clear  to auscultation bilaterally.  CARDIOVASCULAR:  An S1 and S2, regular rate and rhythm, no S3 heard at  this time.  ABDOMEN:  Soft and nontender, positive bowel sounds.  LOWER EXTREMITIES:  Without clubbing, cyanosis, or edema; some chronic  venous insufficiency stasis noted.  NEUROLOGIC:  She is alert and oriented x3.  Cranial nerves II-XII  grossly intact.   IMPRESSION:  Stable Class II heart failure at this time.  I am going to  go ahead and try to titrate Michele Caldwell's Coreg.  We will have her take  3.125 mg in the morning, 6.25 mg in the evening.  We will continue her  other medications for now.  Check lab work today and see her back in one  week.  I have initiated heart failure education with patient and her  granddaughter, they have an educational packet, and my phone number if  they have any problems, they are to call me.      Dorian Pod, ACNP  Electronically  Signed      Rollene Rotunda, MD, [facility]  Electronically Signed   MB/MedQ  DD: 07/26/2006  DT: 07/26/2006  Job #: [id number]

## 2010-09-11 NOTE — Discharge Summary (Signed)
Michele Caldwell, Michele Caldwell                ACCOUNT NO.:  [id number]   MEDICAL RECORD NO.:  [id number]          PATIENT TYPE:  INP   LOCATION:  4702                         FACILITY:  [facility]   PHYSICIAN:  Bruce R. Juanda Chance, MD, FACCDATE OF BIRTH:  [birth year]   DATE OF ADMISSION:  06/29/2006  DATE OF DISCHARGE:  07/07/2006                               DISCHARGE SUMMARY   Primary cardiologist:  Dr. Willa Rough.  Primary care physician is a  Dr. Katrinka Blazing at [facility] in [place].   PRINCIPAL DIAGNOSIS:  Acute systolic congestive heart failure.   SECONDARY DIAGNOSES:  1. Nonischemic cardiomyopathy, ejection fraction of 20%.  2. Normal coronary arteries by catheterization on this admission.  3. Hypertension.  4. Left hand and lower extremity cellulitis.   ALLERGIES:  No known drug allergies.   PROCEDURES:  1. 2-D echocardiogram.  2. Right and left heart cardiac catheterization.   HISTORY OF PRESENT ILLNESS:  A 76 year old African-American female with  no prior history of coronary artery disease, who has been having  worsening dyspnea on exertion as well as lower extremity edema over the  period of approximately 1 month.  On June 29, 2006, while in the beauty  salon getting her hair done, she complained of dyspnea and became  unresponsive.  EMS was called and she was noted to have agonal  respirations requiring a bag-valve-mask and BiPAP.  Oxygen saturations  were noted to be 86% on room air, increasing to 91% on BiPAP.  She was  treated with 160 mg of IV Lasix at the [facility] ER and then  transported to [facility] for further evaluation.   HOSPITAL COURSE:  Upon arrival at [facility] the patient was noted to  have abnormal cardiac enzymes with a CK 251, an MB of 8.7, and a  troponin I 0.08.  her BNP on admission was 2777.  She was maintained on  BiPAP along with IV Lasix, with brisk diuresis.  It was felt that  elevation in cardiac markers was likely secondary to her  degree of  systolic heart failure.  A 2-D echocardiogram was performed on June 30, 2006, revealing an EF of 20% with severe diffuse LV hypokinesis.  Diuresis was continued and the decision was made to pursue left heart  cardiac catheterization in the setting of a finding of new LV  dysfunction.  She was felt to be more or less euvolemic by March 11 and  underwent left heart cardiac catheterization revealing normal coronary  arteries with an EF of 20% and global hypokinesis.  Right heart  catheterization was performed revealing an RA of 7, a PA 56/25 with a of  28, a wedge of 14, cardiac output of 3.6 and a cardiac index of 1.8.  She was maintained on beta blocker and ACE inhibitor therapy and her  Lasix was switched from IV to p.o.  We have had her ambulate in the  hallway with assistance and she did not require any oxygen.  She will be  discharged home today in satisfactory condition.     The patient also  developed a cellulitis felt be secondary to an IV  site in the left hand during this admission.  She was also noted to have  lower extremity edema with weeping and presumed cellulitis.  She had  been treated with intravenous ceftriaxone therapy and this was switched  over to Keflex orally.  All antibiotics have been discontinued at the  time of discharge.   With arrange for follow-up in the [facility] Cardiology heart failure  clinic on March 27 as well as follow-up with Dr. Myrtis Ser on April 23.  Plan  will be for a repeat 2-D echocardiogram in approximately 3 months to  reevaluate LV function on maximal medical therapy.  If LV function did  not improve, we would plan on EP referral for a bi-V ICD.   DISCHARGE LABORATORY DATA:  Hemoglobin 11.8, hematocrit 35.7, WBC 5.6,  platelets 250.  Sodium 39, potassium 4.1, 492, CO2 39, BUN 13,  creatinine 1.09, glucose 95, calcium is 8.9.  Total bilirubin 1.4,  alkaline phosphatase 60, AST 38, ALT 21, albumin 2.7, total protein 7.5.  CK 350, MB  6.9, troponin I 0.07.  BNP 1258 on March 7.  Total  cholesterol 92, triglycerides 34, HDL 38, LDL 47.  Blood cultures were  negative x2. under the medical floor.   DISPOSITION:  The patient is being discharged home today in good  condition.   FOLLOW-UP PLANS AND APPOINTMENTS:  She is to follow up with [facility]  Cardiology heart failure clinic on March 27 at 11:20 a.m.  She is to  follow up with Dr. Willa Rough on April 23 at 9:15 a.m.[name]  She will  follow up with her primary care doctor, Dr. Katrinka Blazing, as previously  scheduled.   DISCHARGE MEDICATIONS:  1. Altace 2.5 mg daily.  2. Carvedilol 3.125 mg b.i.d.  3. Lasix 80 mg daily.  4. Potassium chloride 20 mEq daily.  5. Aspirin 81 mg daily.   OUTSTANDING LAB STUDIES:  None.   DURATION OF DISCHARGE ENCOUNTER:  45 minutes, including physician time.      Michele Caldwell, ANP      Bruce R. Juanda Chance, MD, [facility]  Electronically Signed    CB/MEDQ  D:  07/07/2006  T:  07/08/2006  Job:  [id number]   cc:   Dr. Katrinka Blazing

## 2010-09-19 ENCOUNTER — Other Ambulatory Visit: Payer: Self-pay | Admitting: Internal Medicine

## 2010-10-08 ENCOUNTER — Other Ambulatory Visit: Payer: Self-pay

## 2010-10-08 ENCOUNTER — Ambulatory Visit (INDEPENDENT_AMBULATORY_CARE_PROVIDER_SITE_OTHER): Payer: Medicare Other | Admitting: *Deleted

## 2010-10-08 DIAGNOSIS — I428 Other cardiomyopathies: Secondary | ICD-10-CM

## 2010-10-08 DIAGNOSIS — Z95810 Presence of automatic (implantable) cardiac defibrillator: Secondary | ICD-10-CM

## 2010-10-08 DIAGNOSIS — I4902 Ventricular flutter: Secondary | ICD-10-CM

## 2010-10-12 NOTE — Progress Notes (Signed)
icd remote check  

## 2010-10-16 ENCOUNTER — Other Ambulatory Visit: Payer: Self-pay | Admitting: Internal Medicine

## 2010-12-19 ENCOUNTER — Other Ambulatory Visit: Payer: Self-pay | Admitting: Cardiology

## 2011-01-15 LAB — PROTIME-INR: INR: 1.1

## 2011-01-15 LAB — CBC
Hemoglobin: 10.7 — ABNORMAL LOW
MCHC: 33
MCV: 83.1
RBC: 3.9
WBC: 9.1

## 2011-01-15 LAB — BASIC METABOLIC PANEL
CO2: 23
Chloride: 101
GFR calc Af Amer: 26 — ABNORMAL LOW
Sodium: 133 — ABNORMAL LOW

## 2011-01-22 ENCOUNTER — Other Ambulatory Visit: Payer: Self-pay | Admitting: Internal Medicine

## 2011-03-25 ENCOUNTER — Encounter: Payer: Self-pay | Admitting: Internal Medicine

## 2011-03-26 ENCOUNTER — Encounter: Payer: Medicare Other | Admitting: Internal Medicine

## 2011-04-12 ENCOUNTER — Other Ambulatory Visit: Payer: Self-pay | Admitting: Cardiology

## 2011-04-12 ENCOUNTER — Other Ambulatory Visit: Payer: Self-pay | Admitting: Internal Medicine

## 2011-05-25 ENCOUNTER — Encounter: Payer: Medicare Other | Admitting: Internal Medicine

## 2011-06-17 ENCOUNTER — Encounter: Payer: Self-pay | Admitting: Internal Medicine

## 2011-06-17 ENCOUNTER — Ambulatory Visit (INDEPENDENT_AMBULATORY_CARE_PROVIDER_SITE_OTHER): Payer: Medicare Other | Admitting: Internal Medicine

## 2011-06-17 DIAGNOSIS — I4902 Ventricular flutter: Secondary | ICD-10-CM

## 2011-06-17 DIAGNOSIS — I428 Other cardiomyopathies: Secondary | ICD-10-CM

## 2011-06-17 DIAGNOSIS — Z95810 Presence of automatic (implantable) cardiac defibrillator: Secondary | ICD-10-CM

## 2011-06-17 LAB — ICD DEVICE OBSERVATION
BRDY-0002RV: 40 {beats}/min
CHARGE TIME: 8.7 s
DEVICE MODEL ICD: 505334
RV LEAD AMPLITUDE: 25 mv
TZAT-0001FASTVT: 2
TZAT-0001SLOWVT: 2
TZAT-0013FASTVT: 1
TZAT-0013SLOWVT: 2
TZAT-0018FASTVT: NEGATIVE
TZAT-0018SLOWVT: NEGATIVE
TZON-0003SLOWVT: 333.3 ms
TZST-0001FASTVT: 3
TZST-0001FASTVT: 4
TZST-0001FASTVT: 7
TZST-0001SLOWVT: 5
TZST-0001SLOWVT: 6
TZST-0001SLOWVT: 7
TZST-0003FASTVT: 41 J
TZST-0003FASTVT: 41 J
TZST-0003FASTVT: 41 J
TZST-0003FASTVT: 41 J
TZST-0003SLOWVT: 41 J
TZST-0003SLOWVT: 41 J

## 2011-06-17 NOTE — Assessment & Plan Note (Signed)
Stable on guideline directed therapy

## 2011-06-17 NOTE — Assessment & Plan Note (Signed)
No intercurrent Ventricular tachycardia  

## 2011-06-17 NOTE — Patient Instructions (Addendum)
Your physician wants you to follow-up in: 12 months with Dr Logan Bores will receive a reminder letter in the mail two months in advance. If you don't receive a letter, please call our office to schedule the follow-up appointment.   Remote monitoring is used to monitor your Pacemaker of ICD from home. This monitoring reduces the number of office visits required to check your device to one time per year. It allows [place] to keep an eye on the functioning of your device to ensure it is working properly. You are scheduled for a device check from home on 09/16/2011. You may send your transmission at any time that day. If you have a wireless device, the transmission will be sent automatically. After your physician reviews your transmission, you will receive a postcard with your next transmission date.

## 2011-06-17 NOTE — Progress Notes (Signed)
  HPI  Michele Caldwell is a 77 y.o. female seen in for congestive failure in the context of nonischemic cardiomyopathy with LBBB. She is status post ICD implantation without LV lead that was complicated by high defibrillation threshold requiring the use of a Guidant high-voltage device. Functional status preprocedure was class 1-2 and she did not receive CRT intentionally. This was pre-MADIT-CRT.   The patient denies chest pain, shortness of breath, nocturnal dyspnea, orthopnea or peripheral edema.  There have been no palpitations, lightheadedness or syncope.       Past Medical History  Diagnosis Date  . Pacemaker   . LBBB (left bundle branch block)   . ICD (implantable cardiac defibrillator) in place   . Hypertension   . CHF (congestive heart failure)     systolic heart failure, acute on chronic  . Chronic renal insufficiency     No past surgical history on file.  Current Outpatient Prescriptions  Medication Sig Dispense Refill  . aspirin 81 MG tablet Take 81 mg by mouth daily.        . carvedilol (COREG) 12.5 MG tablet TAKE ONE & ONE-HALF TABLETS BY MOUTH TWICE DAILY  90 tablet  4  . furosemide (LASIX) 40 MG tablet TAKE ONE TABLET BY MOUTH EVERY DAY  30 tablet  6  . ramipril (ALTACE) 1.25 MG capsule TAKE ONE CAPSULE BY MOUTH EVERY DAY  30 capsule  12  . spironolactone (ALDACTONE) 25 MG tablet TAKE ONE TABLET BY MOUTH EVERY DAY  30 tablet  12    No Known Allergies  Review of Systems negative except from HPI and PMH  Physical Exam BP 104/62  Pulse 81  Ht 5\' 6"  (1.676 m)  Wt 196 lb 1.9 oz (88.959 kg)  BMI 31.65 kg/m2 Well developed and well nourished in no acute distress HENT normal E scleral and icterus clear Neck Supple JVP flat; carotids brisk and full Clear to ausculation Regular rate and rhythm, no murmurs gallops or rub Soft with active bowel sounds No clubbing cyanosis none Edema Alert and oriented, grossly normal motor and sensory function Skin Warm and  Dry   Assessment and  Plan

## 2011-06-17 NOTE — Assessment & Plan Note (Signed)
The patient's device was interrogated.  The information was reviewed. No changes were made in the programming.    

## 2011-09-04 ENCOUNTER — Other Ambulatory Visit: Payer: Self-pay | Admitting: Cardiology

## 2011-09-06 NOTE — Telephone Encounter (Signed)
..   Requested Prescriptions   Pending Prescriptions Disp Refills  . carvedilol (COREG) 12.5 MG tablet [Pharmacy Med Name: CARVEDILOL 12.5MG    TAB] 90 tablet 10    Sig: TAKE ONE & ONE-HALF TABLETS BY MOUTH TWICE DAILY

## 2011-09-16 ENCOUNTER — Ambulatory Visit (INDEPENDENT_AMBULATORY_CARE_PROVIDER_SITE_OTHER): Payer: Medicare Other | Admitting: *Deleted

## 2011-09-16 ENCOUNTER — Encounter: Payer: Self-pay | Admitting: Internal Medicine

## 2011-09-16 DIAGNOSIS — I4902 Ventricular flutter: Secondary | ICD-10-CM

## 2011-09-16 DIAGNOSIS — Z95810 Presence of automatic (implantable) cardiac defibrillator: Secondary | ICD-10-CM

## 2011-09-17 LAB — REMOTE ICD DEVICE
ATRIAL PACING ICD: 0 pct
BATTERY VOLTAGE: 2.95 v
BRDY-0002RV: 40 {beats}/min
CHARGE TIME: 9 s
DEV-0020ICD: NEGATIVE
DEVICE MODEL ICD: 505334
FVT: 0
HV IMPEDENCE: 44 Ohm
MODE SWITCH EPISODES: 0
PACEART VT: 0
RV LEAD AMPLITUDE: 25 mv
RV LEAD IMPEDENCE ICD: 511 Ohm
TOT-0006: 20121213000000
TZAT-0001FASTVT: 1
TZAT-0001FASTVT: 2
TZAT-0001SLOWVT: 1
TZAT-0001SLOWVT: 2
TZAT-0002FASTVT: NEGATIVE
TZAT-0013FASTVT: 1
TZAT-0013SLOWVT: 2
TZAT-0013SLOWVT: 2
TZAT-0018FASTVT: NEGATIVE
TZAT-0018FASTVT: NEGATIVE
TZAT-0018SLOWVT: NEGATIVE
TZAT-0018SLOWVT: NEGATIVE
TZON-0003FASTVT: 285.7 ms
TZON-0003SLOWVT: 333.3 ms
TZST-0001FASTVT: 3
TZST-0001FASTVT: 4
TZST-0001FASTVT: 5
TZST-0001FASTVT: 6
TZST-0001FASTVT: 7
TZST-0001SLOWVT: 3
TZST-0001SLOWVT: 4
TZST-0001SLOWVT: 5
TZST-0001SLOWVT: 6
TZST-0001SLOWVT: 7
TZST-0003FASTVT: 41 J
TZST-0003FASTVT: 41 J
TZST-0003FASTVT: 41 J
TZST-0003FASTVT: 41 J
TZST-0003FASTVT: 41 J
TZST-0003SLOWVT: 41 J
TZST-0003SLOWVT: 41 J
TZST-0003SLOWVT: 41 J
TZST-0003SLOWVT: 41 J
TZST-0003SLOWVT: 41 J
VENTRICULAR PACING ICD: 0 pct
VF: 0

## 2011-09-27 ENCOUNTER — Encounter: Payer: Self-pay | Admitting: *Deleted

## 2011-10-25 ENCOUNTER — Other Ambulatory Visit: Payer: Self-pay | Admitting: Internal Medicine

## 2011-11-12 ENCOUNTER — Other Ambulatory Visit: Payer: Self-pay | Admitting: Internal Medicine

## 2011-12-23 ENCOUNTER — Encounter: Payer: Self-pay | Admitting: Internal Medicine

## 2011-12-23 ENCOUNTER — Ambulatory Visit (INDEPENDENT_AMBULATORY_CARE_PROVIDER_SITE_OTHER): Payer: Medicare Other | Admitting: *Deleted

## 2011-12-23 DIAGNOSIS — Z95810 Presence of automatic (implantable) cardiac defibrillator: Secondary | ICD-10-CM

## 2011-12-23 DIAGNOSIS — I4902 Ventricular flutter: Secondary | ICD-10-CM

## 2011-12-24 LAB — REMOTE ICD DEVICE
BATTERY VOLTAGE: 2.95 V
CHARGE TIME: 9 s
DEV-0020ICD: NEGATIVE
DEVICE MODEL ICD: 505334
FVT: 0
MODE SWITCH EPISODES: 0
PACEART VT: 0
RV LEAD AMPLITUDE: 25 mv
TZAT-0001FASTVT: 1
TZAT-0001FASTVT: 2
TZAT-0001SLOWVT: 1
TZAT-0002FASTVT: NEGATIVE
TZAT-0013FASTVT: 1
TZAT-0013SLOWVT: 2
TZAT-0018SLOWVT: NEGATIVE
TZON-0003FASTVT: 285.7 ms
TZON-0003SLOWVT: 333.3 ms
TZST-0001FASTVT: 4
TZST-0001FASTVT: 5
TZST-0001SLOWVT: 5
TZST-0001SLOWVT: 6
TZST-0003FASTVT: 41 J
TZST-0003FASTVT: 41 J
TZST-0003FASTVT: 41 J
TZST-0003SLOWVT: 41 J
TZST-0003SLOWVT: 41 J
VENTRICULAR PACING ICD: 0 pct
VF: 0

## 2012-03-30 ENCOUNTER — Ambulatory Visit (INDEPENDENT_AMBULATORY_CARE_PROVIDER_SITE_OTHER): Payer: Medicare Other | Admitting: *Deleted

## 2012-03-30 DIAGNOSIS — I4902 Ventricular flutter: Secondary | ICD-10-CM

## 2012-03-30 DIAGNOSIS — Z95810 Presence of automatic (implantable) cardiac defibrillator: Secondary | ICD-10-CM

## 2012-04-06 LAB — REMOTE ICD DEVICE
CHARGE TIME: 9 s
DEV-0020ICD: NEGATIVE
MODE SWITCH EPISODES: 0
RV LEAD AMPLITUDE: 23.5 mv
TZAT-0001FASTVT: 1
TZAT-0013FASTVT: 1
TZAT-0013SLOWVT: 2
TZAT-0018FASTVT: NEGATIVE
TZAT-0018SLOWVT: NEGATIVE
TZST-0001FASTVT: 3
TZST-0001SLOWVT: 3
TZST-0001SLOWVT: 4
TZST-0001SLOWVT: 7
TZST-0003FASTVT: 41 J
TZST-0003FASTVT: 41 J
TZST-0003SLOWVT: 41 J
TZST-0003SLOWVT: 41 J
TZST-0003SLOWVT: 41 J
VENTRICULAR PACING ICD: 0 pct
VF: 0

## 2012-04-27 ENCOUNTER — Encounter: Payer: Self-pay | Admitting: *Deleted

## 2012-05-04 ENCOUNTER — Encounter: Payer: Self-pay | Admitting: Internal Medicine

## 2012-05-09 ENCOUNTER — Other Ambulatory Visit: Payer: Self-pay | Admitting: Internal Medicine

## 2012-07-11 ENCOUNTER — Encounter: Payer: Medicare Other | Admitting: Internal Medicine

## 2012-08-17 ENCOUNTER — Other Ambulatory Visit: Payer: Self-pay | Admitting: Internal Medicine

## 2012-08-24 ENCOUNTER — Encounter: Payer: Self-pay | Admitting: Internal Medicine

## 2012-08-24 ENCOUNTER — Ambulatory Visit (INDEPENDENT_AMBULATORY_CARE_PROVIDER_SITE_OTHER): Payer: Medicare Other | Admitting: Internal Medicine

## 2012-08-24 VITALS — BP 118/64 | HR 88 | Ht 66.0 in | Wt 192.1 lb

## 2012-08-24 DIAGNOSIS — I5022 Chronic systolic (congestive) heart failure: Secondary | ICD-10-CM

## 2012-08-24 DIAGNOSIS — I4902 Ventricular flutter: Secondary | ICD-10-CM

## 2012-08-24 DIAGNOSIS — Z95810 Presence of automatic (implantable) cardiac defibrillator: Secondary | ICD-10-CM

## 2012-08-24 LAB — ICD DEVICE OBSERVATION
BATTERY VOLTAGE: 2.94 V
HV IMPEDENCE: 44 Ohm
RV LEAD IMPEDENCE ICD: 523 Ohm
RV LEAD THRESHOLD: 0.8 V
TZAT-0001FASTVT: 1
TZAT-0001SLOWVT: 1
TZAT-0013FASTVT: 1
TZAT-0013SLOWVT: 2
TZAT-0018FASTVT: NEGATIVE
TZAT-0018SLOWVT: NEGATIVE
TZAT-0018SLOWVT: NEGATIVE
TZON-0003FASTVT: 285.7 ms
TZST-0001FASTVT: 3
TZST-0001FASTVT: 5
TZST-0001FASTVT: 6
TZST-0001SLOWVT: 3
TZST-0001SLOWVT: 4
TZST-0001SLOWVT: 7
TZST-0003FASTVT: 41 J
TZST-0003FASTVT: 41 J
TZST-0003SLOWVT: 41 J
TZST-0003SLOWVT: 41 J
TZST-0003SLOWVT: 41 J

## 2012-08-24 NOTE — Assessment & Plan Note (Signed)
euvolemic  Continue current meds 

## 2012-08-24 NOTE — Assessment & Plan Note (Signed)
The patient's device was interrogated.  The information was reviewed. No changes were made in the programming.    

## 2012-08-24 NOTE — Progress Notes (Signed)
Patient has no care team.   HPI  Michele Caldwell is a 78 y.o. female seen in for congestive failure in the context of nonischemic cardiomyopathy with LBBB. She is status post ICD implantation withut  LV lead that was complicated by high defibrillation threshold requiring the use of a Guidant high-voltage device. Functional status preprocedure was class 1-2 and she did not receive CRT intentionally. This was pre-MADIT-CRT.    The patient denies chest pain, shortness of breath, nocturnal dyspnea, orthopnea or peripheral edema. There have been no palpitations, lightheadedness or syncope.    Past Medical History  Diagnosis Date  . Pacemaker   . LBBB (left bundle branch block)   . ICD (implantable cardiac defibrillator) in place   . Hypertension   . CHF (congestive heart failure)     systolic heart failure, acute on chronic  . Chronic renal insufficiency     No past surgical history on file.  Current Outpatient Prescriptions  Medication Sig Dispense Refill  . aspirin 81 MG tablet Take 81 mg by mouth daily.        . carvedilol (COREG) 12.5 MG tablet TAKE ONE & ONE-HALF TABLETS BY MOUTH TWICE DAILY  90 tablet  1  . furosemide (LASIX) 40 MG tablet TAKE ONE TABLET BY MOUTH EVERY DAY  30 tablet  4  . ramipril (ALTACE) 1.25 MG capsule TAKE ONE CAPSULE BY MOUTH EVERY DAY  30 capsule  5  . spironolactone (ALDACTONE) 25 MG tablet TAKE ONE TABLET BY MOUTH EVERY DAY  30 tablet  5   No current facility-administered medications for this visit.    No Known Allergies  Review of Systems negative except from HPI and PMH  Physical Exam BP 118/64  Pulse 88  Wt 192 lb 2 oz (87.147 kg)  BMI 31.02 kg/m2 Well developed and well nourished in no acute distress HENT normal E scleral and icterus clear Neck Supple JVP flat; carotids brisk and full Clear to ausculation  Regular rate and rhythm, no murmurs gallops or rub Soft with active bowel sounds No clubbing cyanosis none Edema Alert and  oriented, grossly normal motor and sensory function Skin Warm and Dry  ECG demonstrates sinus rhythm with bundle branch block  Assessment and  Plan

## 2012-08-24 NOTE — Patient Instructions (Addendum)
Remote monitoring is used to monitor your Pacemaker of ICD from home. This monitoring reduces the number of office visits required to check your device to one time per year. It allows [place] to keep an eye on the functioning of your device to ensure it is working properly. You are scheduled for a device check from home on 11/30/12. You may send your transmission at any time that day. If you have a wireless device, the transmission will be sent automatically. After your physician reviews your transmission, you will receive a postcard with your next transmission date.  Your physician wants you to follow-up in: 1 year with Dr Graciela Husbands. You will receive a reminder letter in the mail two months in advance. If you don't receive a letter, please call our office to schedule the follow-up appointment.

## 2012-08-24 NOTE — Assessment & Plan Note (Signed)
No intercurrent Ventricular tachycardia  

## 2012-10-13 ENCOUNTER — Other Ambulatory Visit: Payer: Self-pay

## 2012-10-13 NOTE — Telephone Encounter (Signed)
Approved     Disp Refills Start End   carvedilol (COREG) 12.5 MG tablet 90 tablet 1 08/17/2012    Sig: TAKE ONE & ONE-HALF TABLETS BY MOUTH TWICE DAILY   Class: Normal   DAW: No   Authorizing Provider: Duke Salvia, MD   Ordering User: Merdis Delay, RMA

## 2012-10-16 ENCOUNTER — Other Ambulatory Visit: Payer: Self-pay | Admitting: Internal Medicine

## 2012-11-07 ENCOUNTER — Other Ambulatory Visit: Payer: Self-pay | Admitting: Internal Medicine

## 2012-11-10 ENCOUNTER — Other Ambulatory Visit: Payer: Self-pay | Admitting: *Deleted

## 2012-11-10 MED ORDER — CARVEDILOL 12.5 MG PO TABS: ORAL_TABLET | ORAL | Status: DC

## 2012-11-27 ENCOUNTER — Encounter: Payer: Medicare Other | Admitting: *Deleted

## 2013-01-02 ENCOUNTER — Other Ambulatory Visit: Payer: Self-pay | Admitting: Internal Medicine

## 2013-02-14 ENCOUNTER — Other Ambulatory Visit: Payer: Self-pay

## 2013-02-14 MED ORDER — CARVEDILOL 12.5 MG PO TABS: ORAL_TABLET | ORAL | Status: DC

## 2013-05-18 ENCOUNTER — Other Ambulatory Visit: Payer: Self-pay

## 2013-05-18 MED ORDER — RAMIPRIL 1.25 MG PO CAPS: ORAL_CAPSULE | ORAL | Status: DC

## 2013-05-18 MED ORDER — SPIRONOLACTONE 25 MG PO TABS: ORAL_TABLET | ORAL | Status: DC

## 2013-06-01 ENCOUNTER — Ambulatory Visit (INDEPENDENT_AMBULATORY_CARE_PROVIDER_SITE_OTHER): Payer: Medicare Other | Admitting: *Deleted

## 2013-06-01 DIAGNOSIS — I4902 Ventricular flutter: Secondary | ICD-10-CM

## 2013-06-13 LAB — MDC_IDC_ENUM_SESS_TYPE_REMOTE
Battery Voltage: 2.93 V
HighPow Impedance: 44 Ohm
Implantable Pulse Generator Serial Number: 505334
Lead Channel Impedance Value: 511 Ohm
Lead Channel Sensing Intrinsic Amplitude: 21.3 mV
Lead Channel Setting Pacing Amplitude: 2.4 V
Lead Channel Setting Pacing Pulse Width: 0.4 ms
MDC IDC SESS DTM: 20150205102856
MDC IDC SET ZONE DETECTION INTERVAL: 250 ms
MDC IDC SET ZONE DETECTION INTERVAL: 285.7 ms
MDC IDC STAT BRADY RA PERCENT PACED: 0 %
MDC IDC STAT BRADY RV PERCENT PACED: 0 %
Zone Setting Detection Interval: 333.3 ms

## 2013-07-16 ENCOUNTER — Encounter: Payer: Self-pay | Admitting: *Deleted

## 2013-07-24 ENCOUNTER — Encounter: Payer: Self-pay | Admitting: Internal Medicine

## 2013-09-05 ENCOUNTER — Other Ambulatory Visit: Payer: Self-pay | Admitting: Internal Medicine

## 2013-09-07 ENCOUNTER — Ambulatory Visit (INDEPENDENT_AMBULATORY_CARE_PROVIDER_SITE_OTHER): Payer: Medicare Other | Admitting: Internal Medicine

## 2013-09-07 ENCOUNTER — Encounter: Payer: Self-pay | Admitting: Internal Medicine

## 2013-09-07 VITALS — BP 129/76 | HR 82 | Ht 65.0 in | Wt 183.0 lb

## 2013-09-07 DIAGNOSIS — I4901 Ventricular fibrillation: Secondary | ICD-10-CM

## 2013-09-07 DIAGNOSIS — I5022 Chronic systolic (congestive) heart failure: Secondary | ICD-10-CM

## 2013-09-07 DIAGNOSIS — I428 Other cardiomyopathies: Secondary | ICD-10-CM

## 2013-09-07 DIAGNOSIS — Z95810 Presence of automatic (implantable) cardiac defibrillator: Secondary | ICD-10-CM

## 2013-09-07 LAB — MDC_IDC_ENUM_SESS_TYPE_INCLINIC
Battery Voltage: 2.92 V
Brady Statistic RA Percent Paced: 0 %
Brady Statistic RV Percent Paced: 0 %
HIGH POWER IMPEDANCE MEASURED VALUE: 44 Ohm
HIGH POWER IMPEDANCE MEASURED VALUE: 44 Ohm
Lead Channel Pacing Threshold Amplitude: 0.4 V
Lead Channel Pacing Threshold Amplitude: 0.8 V
Lead Channel Pacing Threshold Amplitude: 0.8 V
Lead Channel Pacing Threshold Amplitude: 0.8 V
Lead Channel Pacing Threshold Pulse Width: 0.5 ms
Lead Channel Pacing Threshold Pulse Width: 0.5 ms
Lead Channel Pacing Threshold Pulse Width: 0.5 ms
Lead Channel Sensing Intrinsic Amplitude: 21.8 mV
Lead Channel Setting Pacing Amplitude: 2.4 V
Lead Channel Setting Pacing Pulse Width: 0.4 ms
MDC IDC MSMT LEADCHNL RA IMPEDANCE VALUE: 3000 Ohm — AB
MDC IDC MSMT LEADCHNL RV IMPEDANCE VALUE: 500 Ohm
MDC IDC MSMT LEADCHNL RV PACING THRESHOLD PULSEWIDTH: 0.5 ms
MDC IDC PG SERIAL: 505334
MDC IDC SESS DTM: 20150515040000
Zone Setting Detection Interval: 250 ms
Zone Setting Detection Interval: 285 ms
Zone Setting Detection Interval: 333 ms

## 2013-09-07 NOTE — Patient Instructions (Signed)
Your physician wants you to follow-up in:  12 months.  You will receive a reminder letter in the mail two months in advance. If you don't receive a letter, please call our office to schedule the follow-up appointment.   

## 2013-09-07 NOTE — Progress Notes (Signed)
Patient has no care team.   HPI  Michele Caldwell is a 79 y.o. female seen in for congestive failure in the context of nonischemic cardiomyopathy with LBBB. She is status post ICD implantation withut  LV lead that was complicated by high defibrillation threshold requiring the use of a Guidant high-voltage device. Functional status preprocedure was class 1-2 and she did not receive CRT intentionally. This was pre-MADIT-CRT.    The patient denies chest pain, shortness of breath, nocturnal dyspnea, orthopnea or peripheral edema. There have been no palpitations, lightheadedness or syncope.   Her granddaughter notes she has had some swelling of her mouth and lips on the Prempro.  There has also been intermittent swelling. Currently they're swollen and her left hand. It is better than it was a few weeks ago. There has been swelling in her knees as well as her right hand. She does not yet have a primary care physician. She says she will go on her family stopped bothering her about it.   Past Medical History  Diagnosis Date  . Pacemaker   . LBBB (left bundle branch block)   . ICD (implantable cardiac defibrillator) in place   . Hypertension   . CHF (congestive heart failure)     systolic heart failure, acute on chronic  . Chronic renal insufficiency     No past surgical history on file.  Current Outpatient Prescriptions  Medication Sig Dispense Refill  . aspirin 81 MG tablet Take 81 mg by mouth daily.        . carvedilol (COREG) 12.5 MG tablet TAKE ONE & ONE-HALF TABLETS BY MOUTH TWICE DAILY  90 tablet  0  . furosemide (LASIX) 40 MG tablet TAKE ONE TABLET BY MOUTH EVERY DAY  30 tablet  11  . ramipril (ALTACE) 1.25 MG capsule TAKE ONE CAPSULE BY MOUTH EVERY DAY  30 capsule  5  . spironolactone (ALDACTONE) 25 MG tablet TAKE ONE TABLET BY MOUTH EVERY DAY  30 tablet  5   No current facility-administered medications for this visit.    No Known Allergies  Review of Systems negative except  from HPI and PMH  Physical Exam BP 129/76  Pulse 82  Ht 5\' 5"  (1.651 m)  Wt 183 lb (83.008 kg)  BMI 30.45 kg/m2 Well developed and well nourished in no acute distress HENT normal E scleral and icterus clear Neck Supplel Clear to ausculation  Regular rate and rhythm, no murmurs gallops or rub Soft with active bowel sounds No clubbing cyanosis or swelling of her left hand  Alert and oriented, grossly normal motor and sensory function Skin Warm and Dry  ECG demonstrates sinus rhythm with left bundle branch block 16/20/49 axis leftward at -34 blood  Assessment and  Plan Nonischemic cardiomyopathy   ICD-[employer]  Angioedema\ \ Left bundle branch block  Left hand swelling  Chronic renal insufficiency grade 3   Her left hand  swelling is concerning for obstruction of the venous system ipsilateral to her device. However, she has had contralateral swelling of the hand as well as swelling of the knees and I wonder whether this is a manifestation of some migratory polyarthritis syndrome although I impressed that the hand is not all that warm.  I've asked her to let us know next couple of weeks whether the swelling has completely resolved.  She has a cardiomyopathy; however, with her angioedema i.e. swelling of her lips and mouth that has occurred periodically, I think is important to stop her ACE inhibitor.  Because of cross-reactivitywe will not use an ARB.   We will check a metabolic profile. Last blood work was 3 years ago which is associated with elevated creatinine   He does not have a primary care physician. She is encouraged again to get one.

## 2013-10-08 ENCOUNTER — Other Ambulatory Visit: Payer: Self-pay | Admitting: Internal Medicine

## 2013-10-22 ENCOUNTER — Telehealth: Payer: Self-pay | Admitting: *Deleted

## 2013-10-22 NOTE — Telephone Encounter (Signed)
Attempting to reach pt about BMET f/u Dr. Graciela Husbands wanted after last office visit. Also wanting to check on left hand swelling and if it has resolved. No answer/voicemail.

## 2013-10-24 ENCOUNTER — Other Ambulatory Visit: Payer: Self-pay | Admitting: *Deleted

## 2013-10-24 MED ORDER — FUROSEMIDE 40 MG PO TABS: ORAL_TABLET | ORAL | Status: DC

## 2013-11-06 NOTE — Telephone Encounter (Signed)
lmtcb

## 2013-11-18 ENCOUNTER — Other Ambulatory Visit: Payer: Self-pay

## 2013-11-18 MED ORDER — SPIRONOLACTONE 25 MG PO TABS: ORAL_TABLET | ORAL | Status: DC

## 2013-11-20 ENCOUNTER — Other Ambulatory Visit: Payer: Self-pay | Admitting: Internal Medicine

## 2013-12-10 ENCOUNTER — Ambulatory Visit (INDEPENDENT_AMBULATORY_CARE_PROVIDER_SITE_OTHER): Payer: Medicare Other | Admitting: *Deleted

## 2013-12-10 DIAGNOSIS — I428 Other cardiomyopathies: Secondary | ICD-10-CM

## 2013-12-10 LAB — MDC_IDC_ENUM_SESS_TYPE_REMOTE
Lead Channel Setting Pacing Amplitude: 2.4 V
Lead Channel Setting Pacing Pulse Width: 0.4 ms
MDC IDC MSMT LEADCHNL RV SENSING INTR AMPL: 18.8 mV
MDC IDC PG SERIAL: 505334
MDC IDC SET ZONE DETECTION INTERVAL: 285 ms
Zone Setting Detection Interval: 250 ms
Zone Setting Detection Interval: 333 ms

## 2013-12-10 NOTE — Progress Notes (Signed)
Remote ICD transmission.   

## 2013-12-18 ENCOUNTER — Encounter: Payer: Self-pay | Admitting: Cardiology

## 2013-12-25 ENCOUNTER — Encounter: Payer: Self-pay | Admitting: Internal Medicine

## 2014-03-13 ENCOUNTER — Ambulatory Visit (INDEPENDENT_AMBULATORY_CARE_PROVIDER_SITE_OTHER): Payer: Medicare Other | Admitting: *Deleted

## 2014-03-13 DIAGNOSIS — I428 Other cardiomyopathies: Secondary | ICD-10-CM

## 2014-03-13 DIAGNOSIS — I429 Cardiomyopathy, unspecified: Secondary | ICD-10-CM

## 2014-03-13 LAB — MDC_IDC_ENUM_SESS_TYPE_REMOTE
Brady Statistic RV Percent Paced: 0 %
HighPow Impedance: 42 Ohm
Lead Channel Sensing Intrinsic Amplitude: 21 mV
Lead Channel Setting Pacing Amplitude: 2.4 V
MDC IDC MSMT BATTERY VOLTAGE: 29.1 V
MDC IDC MSMT LEADCHNL RV IMPEDANCE VALUE: 479 Ohm
MDC IDC PG SERIAL: 505334
MDC IDC SET LEADCHNL RV PACING PULSEWIDTH: 0.4 ms
MDC IDC SET ZONE DETECTION INTERVAL: 250 ms
MDC IDC SET ZONE DETECTION INTERVAL: 285.71 ms
Zone Setting Detection Interval: 333 ms

## 2014-03-13 NOTE — Progress Notes (Signed)
Remote ICD transmission.   

## 2014-04-01 ENCOUNTER — Other Ambulatory Visit: Payer: Self-pay | Admitting: Internal Medicine

## 2014-04-01 ENCOUNTER — Encounter: Payer: Self-pay | Admitting: Cardiology

## 2014-04-05 ENCOUNTER — Encounter: Payer: Self-pay | Admitting: Internal Medicine

## 2014-05-03 ENCOUNTER — Other Ambulatory Visit: Payer: Self-pay | Admitting: Internal Medicine

## 2014-06-12 ENCOUNTER — Other Ambulatory Visit: Payer: Self-pay | Admitting: Internal Medicine

## 2014-06-13 ENCOUNTER — Other Ambulatory Visit: Payer: Self-pay

## 2014-06-13 ENCOUNTER — Ambulatory Visit (INDEPENDENT_AMBULATORY_CARE_PROVIDER_SITE_OTHER): Payer: Medicare Other | Admitting: *Deleted

## 2014-06-13 DIAGNOSIS — I429 Cardiomyopathy, unspecified: Secondary | ICD-10-CM

## 2014-06-13 DIAGNOSIS — I428 Other cardiomyopathies: Secondary | ICD-10-CM

## 2014-06-13 MED ORDER — SPIRONOLACTONE 25 MG PO TABS: ORAL_TABLET | ORAL | Status: DC

## 2014-06-13 NOTE — Progress Notes (Signed)
Remote ICD transmission.   

## 2014-06-14 LAB — MDC_IDC_ENUM_SESS_TYPE_REMOTE
Battery Voltage: 2.91 V
Brady Statistic RV Percent Paced: 0 %
HIGH POWER IMPEDANCE MEASURED VALUE: 45 Ohm
Implantable Pulse Generator Serial Number: 505334
Lead Channel Setting Pacing Pulse Width: 0.4 ms
MDC IDC MSMT LEADCHNL RV IMPEDANCE VALUE: 489 Ohm
MDC IDC MSMT LEADCHNL RV SENSING INTR AMPL: 20.6 mV
MDC IDC SET LEADCHNL RV PACING AMPLITUDE: 2.4 V
MDC IDC SET ZONE DETECTION INTERVAL: 285.71 ms
Zone Setting Detection Interval: 250 ms
Zone Setting Detection Interval: 333 ms

## 2014-07-03 ENCOUNTER — Encounter: Payer: Self-pay | Admitting: *Deleted

## 2014-07-04 ENCOUNTER — Encounter: Payer: Self-pay | Admitting: Internal Medicine

## 2014-09-27 ENCOUNTER — Other Ambulatory Visit: Payer: Self-pay | Admitting: Internal Medicine

## 2014-11-07 ENCOUNTER — Other Ambulatory Visit: Payer: Self-pay | Admitting: Internal Medicine

## 2014-11-27 ENCOUNTER — Encounter: Payer: Self-pay | Admitting: Internal Medicine

## 2014-11-27 ENCOUNTER — Ambulatory Visit (INDEPENDENT_AMBULATORY_CARE_PROVIDER_SITE_OTHER): Payer: Self-pay | Admitting: Internal Medicine

## 2014-11-27 VITALS — BP 110/80 | HR 94 | Ht 66.0 in | Wt 183.4 lb

## 2014-11-27 DIAGNOSIS — Z95810 Presence of automatic (implantable) cardiac defibrillator: Secondary | ICD-10-CM

## 2014-11-27 DIAGNOSIS — Z4502 Encounter for adjustment and management of automatic implantable cardiac defibrillator: Secondary | ICD-10-CM

## 2014-11-27 DIAGNOSIS — Z79899 Other long term (current) drug therapy: Secondary | ICD-10-CM

## 2014-11-27 DIAGNOSIS — I429 Cardiomyopathy, unspecified: Secondary | ICD-10-CM

## 2014-11-27 DIAGNOSIS — I5022 Chronic systolic (congestive) heart failure: Secondary | ICD-10-CM

## 2014-11-27 DIAGNOSIS — I428 Other cardiomyopathies: Secondary | ICD-10-CM

## 2014-11-27 LAB — CUP PACEART INCLINIC DEVICE CHECK
HighPow Impedance: 44 Ohm
HighPow Impedance: 44 Ohm
Lead Channel Impedance Value: 489 Ohm
Lead Channel Pacing Threshold Amplitude: 0.8 V
Lead Channel Pacing Threshold Pulse Width: 0.5 ms
Lead Channel Pacing Threshold Pulse Width: 0.5 ms
Lead Channel Pacing Threshold Pulse Width: 0.5 ms
Lead Channel Pacing Threshold Pulse Width: 0.5 ms
Lead Channel Sensing Intrinsic Amplitude: 22.6 mV
Lead Channel Setting Pacing Pulse Width: 0.4 ms
MDC IDC MSMT BATTERY VOLTAGE: 2.89 V
MDC IDC MSMT LEADCHNL RA IMPEDANCE VALUE: 3000 Ohm — AB
MDC IDC MSMT LEADCHNL RV PACING THRESHOLD AMPLITUDE: 0.8 V
MDC IDC MSMT LEADCHNL RV PACING THRESHOLD AMPLITUDE: 0.8 V
MDC IDC MSMT LEADCHNL RV PACING THRESHOLD AMPLITUDE: 0.8 V
MDC IDC SESS DTM: 20160803040000
MDC IDC SET LEADCHNL RV PACING AMPLITUDE: 2.4 V
MDC IDC SET ZONE DETECTION INTERVAL: 333 ms
MDC IDC STAT BRADY RA PERCENT PACED: 0 %
MDC IDC STAT BRADY RV PERCENT PACED: 0 %
Pulse Gen Serial Number: 505334
Zone Setting Detection Interval: 250 ms
Zone Setting Detection Interval: 285 ms

## 2014-11-27 LAB — BASIC METABOLIC PANEL
BUN: 42 mg/dL — ABNORMAL HIGH (ref 6–23)
CALCIUM: 9.4 mg/dL (ref 8.4–10.5)
CO2: 33 mEq/L — ABNORMAL HIGH (ref 19–32)
Chloride: 100 mEq/L (ref 96–112)
Creatinine, Ser: 1.68 mg/dL — ABNORMAL HIGH (ref 0.40–1.20)
GFR: 37.92 mL/min — ABNORMAL LOW (ref >60.00)
Glucose, Bld: 109 mg/dL — ABNORMAL HIGH (ref 70–99)
Potassium: 3.9 mEq/L (ref 3.5–5.1)
SODIUM: 139 meq/L (ref 135–145)

## 2014-11-27 NOTE — Patient Instructions (Addendum)
Medication Instructions:  Your physician recommends that you continue on your current medications as directed. Please refer to the Current Medication list given to you today.  Labwork: Today - medication surveillance lab: BMET  Testing/Procedures: None ordered  Follow-Up: Remote monitoring is used to monitor your Pacemaker of ICD from home. This monitoring reduces the number of office visits required to check your device to one time per year. It allows [place] to keep an eye on the functioning of your device to ensure it is working properly. You are scheduled for a device check from home on 02/26/15. You may send your transmission at any time that day. If you have a wireless device, the transmission will be sent automatically. After your physician reviews your transmission, you will receive a postcard with your next transmission date.  Your physician wants you to follow-up in: 6 months with Gypsy Balsam, NP.  You will receive a reminder letter in the mail two months in advance. If you don't receive a letter, please call our office to schedule the follow-up appointment.  Any Other Special Instructions Will Be Listed Below (If Applicable). Thank you for choosing [facility]!!

## 2014-11-27 NOTE — Progress Notes (Signed)
Patient Care Team: No Pcp Per Patient as PCP - General (General Practice)   HPI  Michele Caldwell is a 80 y.o. female seen in for congestive failure in the context of nonischemic cardiomyopathy with LBBB. She is status post ICD implantation withut  LV lead that was complicated by high defibrillation threshold requiring the use of a Guidant high-voltage device. Functional status preprocedure was class 1-2 and she did not receive CRT intentionally. This was pre-MADIT-CRT.    The patient denies chest pain, shortness of breath, nocturnal dyspnea, orthopnea or peripheral edema. There have been no palpitations, lightheadedness or syncope.   No med questions    Past Medical History  Diagnosis Date  . Pacemaker   . LBBB (left bundle branch block)   . ICD (implantable cardiac defibrillator) in place   . Hypertension   . CHF (congestive heart failure)     systolic heart failure, acute on chronic  . Chronic renal insufficiency     No past surgical history on file.  Current Outpatient Prescriptions  Medication Sig Dispense Refill  . aspirin 81 MG tablet Take 81 mg by mouth daily.      . carvedilol (COREG) 12.5 MG tablet TAKE ONE & ONE-HALF TABLETS BY MOUTH TWICE DAILY 90 tablet 1  . furosemide (LASIX) 40 MG tablet TAKE ONE TABLET BY MOUTH ONCE DAILY 30 tablet 10  . spironolactone (ALDACTONE) 25 MG tablet TAKE ONE TABLET BY MOUTH EVERY DAY 30 tablet 5   No current facility-administered medications for this visit.    No Known Allergies  Review of Systems negative except from HPI and PMH  Physical Exam BP 110/80 mmHg  Pulse 94  Ht 5\' 6"  (1.676 m)  Wt 183 lb 6.4 oz (83.19 kg)  BMI 29.62 kg/m2 Well developed and well nourished in no acute distress HENT normal E scleral and icterus clear Neck Supplel Clear to ausculation Device pocket well healed; without hematoma or erythema.  There is no tethering   Regular rate and rhythm, no murmurs gallops or rub Soft with active bowel  sounds No clubbing cyanosis or swelling   Alert and oriented, grossly normal motor and sensory function Skin Warm and Dry  ECG demonstrates sinus rhythm with left bundle branch block 16/20/49   Assessment and  Plan Nonischemic cardiomyopathy   ICD-[employer] \ Left bundle branch block  Left hand swelling  Chronic renal insufficiency grade 3     Stable  Needs surveillance labs . Euvolemic continue current meds

## 2014-11-30 ENCOUNTER — Other Ambulatory Visit: Payer: Self-pay | Admitting: Internal Medicine

## 2015-01-24 ENCOUNTER — Other Ambulatory Visit: Payer: Self-pay | Admitting: Internal Medicine

## 2015-02-26 ENCOUNTER — Ambulatory Visit (INDEPENDENT_AMBULATORY_CARE_PROVIDER_SITE_OTHER): Payer: Self-pay | Admitting: *Deleted

## 2015-02-26 DIAGNOSIS — I429 Cardiomyopathy, unspecified: Secondary | ICD-10-CM

## 2015-02-26 DIAGNOSIS — I428 Other cardiomyopathies: Secondary | ICD-10-CM

## 2015-02-28 NOTE — Progress Notes (Signed)
Remote ICD transmission.   

## 2015-03-19 ENCOUNTER — Encounter: Payer: Self-pay | Admitting: Cardiology

## 2015-03-19 LAB — CUP PACEART REMOTE DEVICE CHECK
Brady Statistic RA Percent Paced: 0 %
Date Time Interrogation Session: 20161123091806
HighPow Impedance: 40 Ohm
Implantable Lead Implant Date: 20090203
Implantable Lead Model: 158
Implantable Lead Serial Number: 181624
Lead Channel Impedance Value: 463 Ohm
Lead Channel Sensing Intrinsic Amplitude: 17.5 mV
Lead Channel Setting Pacing Amplitude: 2.4 V
MDC IDC LEAD LOCATION: 753860
MDC IDC PG SERIAL: 505334
MDC IDC SET LEADCHNL RV PACING PULSEWIDTH: 0.4 ms
MDC IDC STAT BRADY RV PERCENT PACED: 0 %

## 2015-05-02 ENCOUNTER — Telehealth: Payer: Self-pay | Admitting: Internal Medicine

## 2015-05-02 NOTE — Telephone Encounter (Signed)
I reviewed the patient's symptoms with Dr. Delton See (DOD). She would like the patient to have a lower extremity venous duplex today/ Monday. I called and spoke with the patient and advised her of Dr. Lindaann Slough recommendations. She states she will not come up here today in light of the pending bad weather. She tells me this morning, that her swelling her in left leg has improved over night. I inquired if she has an Urgent Care near her and she said she did. I advised her to please go there for evaluation, but she is not sure she will do this. I asked if she has a support sock/ tight fitting sock that she can use and verbalizes she would have her daughter obtain this for her. She does confirm increased sodium intake recently. I have advised her to decrease her sodium intake and be aware of processed meats/ canned foods. She will obtain support socks to be worn and elevate her extremities when sitting. I advised her to call back on Monday if her symptoms do not improve/ worsen. I have again advised her to have Urgent Care evaluate her over the weekend. She verbalizes understanding of the above, but did not commit to evaluation by Urgent Care.

## 2015-05-02 NOTE — Telephone Encounter (Signed)
I called and spoke with the patient's daughter, Michele Caldwell. She reports that she saw the patient yesterday and that the patient had swelling to her left leg (not the foot). The right leg was normal size for her. The patient denied pain in her leg and there was no temperature change. Michele Caldwell stated that her niece, who is a Engineer, civil (consulting), went by and looked at this last night and the edema was pitting. She denies SOB. She does not weigh herself. Her urine output is presumed to be normal per her daughter. The patient is currently taking lasix 40 mg once daily. She has a history of renal insufficiency- last BUN/ creatinine (11/27/14) 42/1.68.  Her creatinine historically ranges from 1.5-2.0. Per Michele Caldwell, the patient only wants to see Dr. Graciela Husbands- I advised he is not in the office today. She has not established with a PCP. I advised Michele Caldwell I will review w/ the MD/ PA in the office today and call her back. She is agreeable and asked that I call the patient back directly at [phone].

## 2015-05-02 NOTE — Telephone Encounter (Signed)
New message  Pt c/o swelling: STAT is pt has developed SOB within 24 hours  1. How long have you been experiencing swelling? Not sure, The daughter called in for the patient   2. Where is the swelling located? Left leg is swollen with fluid  3.  Are you currently taking a "fluid pill"? Yes   4.  Are you currently SOB? Not to the daughters knowledge  5.  Have you traveled recently?no   Comments: Daughter called for the patient. She wanted an appt with Dr. Graciela Husbands within the next week. She states that the last time her mother had these symptoms it was an urgent matter. Please assist

## 2015-05-08 ENCOUNTER — Telehealth: Payer: Self-pay | Admitting: Internal Medicine

## 2015-05-08 NOTE — Telephone Encounter (Signed)
New message  Pt daughter called states that the pt is still having pain her left legs and now dropped a pot on her foot. From the call last week the pt was advised to call in if an appt is needed and so she is requesting a call back with an appt//

## 2015-05-08 NOTE — Telephone Encounter (Signed)
I called and spoke with the patient's daughter, Aggie Cosier. She reports that the patient is having continued leg swelling as per our conversation on 05/02/15. The patient has worn compression stockings and this has helped some. The patient and her daughter are requesting a follow up appointment for continued swelling. I advised I do not have anything in the next few weeks with Dr. Graciela Husbands but did offer her follow up with the PA/ NP. Aggie Cosier is agreeable with the patient seeing Boyce Medici, PA on 05/12/15 at 3:00 pm. Aggie Cosier did mention that the patient had dropped a pot on her foot and was having some pain in her foot from this. I advised her if they suspect the patient has a foot injury, they would need to have this evaluated at an Urgent Care as the patient has no PCP and we do not evaluate foot injuries. Aggie Cosier voices understanding.

## 2015-05-12 ENCOUNTER — Ambulatory Visit (INDEPENDENT_AMBULATORY_CARE_PROVIDER_SITE_OTHER): Payer: Self-pay | Admitting: Cardiology

## 2015-05-12 ENCOUNTER — Encounter: Payer: Self-pay | Admitting: Cardiology

## 2015-05-12 VITALS — BP 98/60 | HR 56 | Ht 66.0 in | Wt 194.8 lb

## 2015-05-12 DIAGNOSIS — I5031 Acute diastolic (congestive) heart failure: Secondary | ICD-10-CM

## 2015-05-12 LAB — BASIC METABOLIC PANEL
BUN: 40 mg/dL — ABNORMAL HIGH (ref 7–25)
CALCIUM: 9 mg/dL (ref 8.6–10.4)
CO2: 26 mmol/L (ref 20–31)
Chloride: 96 mmol/L — ABNORMAL LOW (ref 98–110)
Creat: 1.55 mg/dL — ABNORMAL HIGH (ref 0.60–0.93)
Glucose, Bld: 150 mg/dL — ABNORMAL HIGH (ref 65–99)
Potassium: 3.7 mmol/L (ref 3.5–5.3)
SODIUM: 133 mmol/L — AB (ref 135–146)

## 2015-05-12 MED ORDER — SPIRONOLACTONE 25 MG PO TABS: 25.0000 mg | ORAL_TABLET | Freq: Every day | ORAL | Status: DC

## 2015-05-12 MED ORDER — CARVEDILOL 12.5 MG PO TABS: 18.7500 mg | ORAL_TABLET | Freq: Two times a day (BID) | ORAL | Status: DC

## 2015-05-12 MED ORDER — FUROSEMIDE 40 MG PO TABS: 40.0000 mg | ORAL_TABLET | Freq: Every day | ORAL | Status: DC

## 2015-05-12 NOTE — Patient Instructions (Signed)
Medication Instructions:  Your physician has recommended you make the following change in your medication:  INCREASE Lasix to 40mg  Twice Dialy  Labwork: Bmet, Bnp today  Testing/Procedures: None ordered  Follow-Up: Your physician recommends that you schedule a follow-up appointment by the end of the week   Any Other Special Instructions Will Be Listed Below (If Applicable). Your physician recommends that you weigh, daily, at the same time every day, and in the same amount of clothing. Please record your daily weights.  Call the office if your weight increases or you develop shortness of breath at rest  Please limit your sodium intake       Low-Sodium Eating Plan    Sodium raises blood pressure and causes water to be held in the body. Getting less sodium from food will help lower your blood pressure, reduce any swelling, and protect your heart, liver, and kidneys. We get sodium by adding salt (sodium chloride) to food. Most of our sodium comes from canned, boxed, and frozen foods. Restaurant foods, fast foods, and pizza are also very high in sodium. Even if you take medicine to lower your blood pressure or to reduce fluid in your body, getting less sodium from your food is important.  WHAT IS MY PLAN?  Most people should limit their sodium intake to 2,300 mg a day. Your health care provider recommends that you limit your sodium intake to __________ a day.  WHAT DO I NEED TO KNOW ABOUT THIS EATING PLAN?  For the low-sodium eating plan, you will follow these general guidelines:  Choose foods with a % Daily Value for sodium of less than 5% (as listed on the food label).  Use salt-free seasonings or herbs instead of table salt or sea salt.  Check with your health care provider or pharmacist before using salt substitutes.  Eat fresh foods.  Eat more vegetables and fruits.  Limit canned vegetables. If you do use them, rinse them well to decrease the sodium.  Limit cheese to 1 oz (28  g) per day.  Eat lower-sodium products, often labeled as "lower sodium" or "no salt added."  Avoid foods that contain monosodium glutamate (MSG). MSG is sometimes added to [place] food and some canned foods.  Check food labels (Nutrition Facts labels) on foods to learn how much sodium is in one serving.  Eat more home-cooked food and less restaurant, buffet, and fast food.  When eating at a restaurant, ask that your food be prepared with less salt, or no salt if possible.  HOW DO I READ FOOD LABELS FOR SODIUM INFORMATION?  The Nutrition Facts label lists the amount of sodium in one serving of the food. If you eat more than one serving, you must multiply the listed amount of sodium by the number of servings.  Food labels may also identify foods as:  Sodium free--Less than 5 mg in a serving.  Very low sodium--35 mg or less in a serving.  Low sodium--140 mg or less in a serving.  Light in sodium--50% less sodium in a serving. For example, if a food that usually has 300 mg of sodium is changed to become light in sodium, it will have 150 mg of sodium.  Reduced sodium--25% less sodium in a serving. For example, if a food that usually has 400 mg of sodium is changed to reduced sodium, it will have 300 mg of sodium. WHAT FOODS CAN I EAT?  Grains  Low-sodium cereals, including oats, puffed wheat and rice, and shredded wheat  cereals. Low-sodium crackers. Unsalted rice and pasta. Lower-sodium bread.  Vegetables  Frozen or fresh vegetables. Low-sodium or reduced-sodium canned vegetables. Low-sodium or reduced-sodium tomato sauce and paste. Low-sodium or reduced-sodium tomato and vegetable juices.  Fruits  Fresh, frozen, and canned fruit. Fruit juice.  Meat and Other Protein Products  Low-sodium canned tuna and salmon. Fresh or frozen meat, poultry, seafood, and fish. Lamb. Unsalted nuts. Dried beans, peas, and lentils without added salt. Unsalted canned beans. Homemade soups without salt. Eggs.  Dairy    Milk. Soy milk. Ricotta cheese. Low-sodium or reduced-sodium cheeses. Yogurt.  Condiments  Fresh and dried herbs and spices. Salt-free seasonings. Onion and garlic powders. Low-sodium varieties of mustard and ketchup. Fresh or refrigerated horseradish. Lemon juice.  Fats and Oils  Reduced-sodium salad dressings. Unsalted butter.  Other  Unsalted popcorn and pretzels.  The items listed above may not be a complete list of recommended foods or beverages. Contact your dietitian for more options.  WHAT FOODS ARE NOT RECOMMENDED?  Grains  Instant hot cereals. Bread stuffing, pancake, and biscuit mixes. Croutons. Seasoned rice or pasta mixes. Noodle soup cups. Boxed or frozen macaroni and cheese. Self-rising flour. Regular salted crackers.  Vegetables  Regular canned vegetables. Regular canned tomato sauce and paste. Regular tomato and vegetable juices. Frozen vegetables in sauces. Salted [place] fries. Olives. Rosita Fire. Relishes. Sauerkraut. Salsa.  Meat and Other Protein Products  Salted, canned, smoked, spiced, or pickled meats, seafood, or fish. Bacon, ham, sausage, hot dogs, corned beef, chipped beef, and packaged luncheon meats. Salt pork. Jerky. Pickled herring. Anchovies, regular canned tuna, and sardines. Salted nuts.  Dairy  Processed cheese and cheese spreads. Cheese curds. Blue cheese and cottage cheese. Buttermilk.  Condiments  Onion and garlic salt, seasoned salt, table salt, and sea salt. Canned and packaged gravies. Worcestershire sauce. Tartar sauce. Barbecue sauce. Teriyaki sauce. Soy sauce, including reduced sodium. Steak sauce. Fish sauce. Oyster sauce. Cocktail sauce. Horseradish that you find on the shelf. Regular ketchup and mustard. Meat flavorings and tenderizers. Bouillon cubes. Hot sauce. Tabasco sauce. Marinades. Taco seasonings. Relishes.  Fats and Oils  Regular salad dressings. Salted butter. Margarine. Ghee. Bacon fat.  Other  Potato and tortilla chips. Corn chips and  puffs. Salted popcorn and pretzels. Canned or dried soups. Pizza. Frozen entrees and pot pies.  The items listed above may not be a complete list of foods and beverages to avoid. Contact your dietitian for more information.  This information is not intended to replace advice given to you by your health care provider. Make sure you discuss any questions you have with your health care provider.  Document Released: 10/02/2001 Document Revised: 05/03/2014 Document Reviewed: 02/14/2013  Elsevier Interactive Patient Education [employer]             If you need a refill on your cardiac medications before your next appointment, please call your pharmacy.

## 2015-05-12 NOTE — Progress Notes (Signed)
05/12/2015 Michele Caldwell   May 07, 1934  [id number]  Primary Physician No PCP Per Patient Primary Cardiologist/ Electrophysiologist: Dr. Graciela Husbands  Reason for Visit/CC: lower extremity edema  HPI:  The patient is a 81 year old female, followed by Dr. Graciela Husbands. She has a h/o nonischemic cardiomyopathy with a left bundle branch block. She is status post ICD implantation. Her last assessment of EF was in October 2011. At that time, EF was 15%. Moderate mitral regurgitation was also noted. Trivial aortic insufficiency was noted as well. She also has a history of chronic renal insufficiency with baseline serum creatinine around 1.6. Her current medical therapy includes aspirin, carvedilol, Lasix and spironolactone. She was last seen by Dr. Graciela Husbands 11/27/2014. At that time, she was noted to be stable from a cardiac standpoint. She denied any chest pain, shortness of breath, PND, orthopnea or edema. Per office note, she was euvolemic on physical exam. Her weight at that time was 183 pounds. Her medications were continued without change.  She now presents to clinic with a new complaint of LEE. Her weight has increased compared to previous OV. Her weight is up 11 lbs at 194 lb. She has noticed increasing LEE over a 2 week period. She also recently developed mild DOE and orthopnea. She denies PND and no resting dyspnea. No chest discomfort. She admits to dietary indiscretion with sodium (Pizza, Bogangles, sausage and bacon). She reports full compliance with lasix. She has not been compliant with daily weights.    Current Outpatient Prescriptions  Medication Sig Dispense Refill  . aspirin 81 MG tablet Take 81 mg by mouth daily.      . carvedilol (COREG) 12.5 MG tablet TAKE ONE & ONE-HALF TABLETS BY MOUTH TWICE DAILY 90 tablet 5  . furosemide (LASIX) 40 MG tablet TAKE ONE TABLET BY MOUTH ONCE DAILY 30 tablet 10  . spironolactone (ALDACTONE) 25 MG tablet TAKE ONE TABLET BY MOUTH ONCE DAILY 30 tablet 5   No current  facility-administered medications for this visit.    No Known Allergies  Social History   Social History  . Marital Status: Single    Spouse Name: N/A  . Number of Children: N/A  . Years of Education: N/A   Occupational History  . Not on file.   Social History Main Topics  . Smoking status: Never Smoker   . Smokeless tobacco: Never Used  . Alcohol Use: No  . Drug Use: Not on file  . Sexual Activity: Not on file   Other Topics Concern  . Not on file   Social History Narrative     Review of Systems: General: negative for chills, fever, night sweats or weight changes.  Cardiovascular: negative for chest pain, dyspnea on exertion, edema, orthopnea, palpitations, paroxysmal nocturnal dyspnea or shortness of breath Dermatological: negative for rash Respiratory: negative for cough or wheezing Urologic: negative for hematuria Abdominal: negative for nausea, vomiting, diarrhea, bright red blood per rectum, melena, or hematemesis Neurologic: negative for visual changes, syncope, or dizziness All other systems reviewed and are otherwise negative except as noted above.    Height  (1.676 m), weight 194 lb 12.8 oz (88.361 kg).  General appearance: alert, cooperative and no distress Neck: no carotid bruit and no JVD Lungs: clear to auscultation bilaterally Heart: regular rate and rhythm and 1/6 SM along LSB Extremities: 2+ bilateral LEE Pulses: 2+ and symmetric Skin: warm and dry Neurologic: Grossly normal  EKG not performed.  ASSESSMENT AND PLAN:   1. Acute on Chronic Systolic CHF: known  NICM with EF of 15%. She presents with volume overload with 11 lb weight gain since last OV, 2+ bilateral LEE and mild DOE. No resting dyspnea. No CP. This is in the setting of recent dietary indiscretion with sodium. We'll plan to temporarily increase her Lasix from 40 mg daily to 40 mg twice a day. She has a history of chronic renal insufficiency with baseline serum creatinine of 1.5.  We will check a BMP today. Check a BNP. She has been ordered to avoid sodium and to monitor weight daily to ensure that she has no further weight gain. She is to return at the end of this week for repeat evaluation as well as a follow-up BMP to reassess renal function and electrolytes.  PLAN  F/u in 4-5 days for repeat assessment.   Robbie Lis PA-C 05/12/2015 3:04 PM

## 2015-05-13 LAB — BRAIN NATRIURETIC PEPTIDE: BRAIN NATRIURETIC PEPTIDE: 2995.9 pg/mL — AB (ref 0.0–100.0)

## 2015-05-15 ENCOUNTER — Ambulatory Visit: Payer: Medicare Other | Admitting: Physician Assistant

## 2015-05-16 ENCOUNTER — Ambulatory Visit: Payer: Medicare Other | Admitting: Physician Assistant

## 2015-05-16 ENCOUNTER — Encounter: Payer: Self-pay | Admitting: Physician Assistant

## 2015-05-16 ENCOUNTER — Ambulatory Visit (INDEPENDENT_AMBULATORY_CARE_PROVIDER_SITE_OTHER): Payer: Self-pay | Admitting: Physician Assistant

## 2015-05-16 VITALS — BP 100/70 | HR 80 | Ht 66.0 in | Wt 195.4 lb

## 2015-05-16 DIAGNOSIS — I5022 Chronic systolic (congestive) heart failure: Secondary | ICD-10-CM

## 2015-05-16 DIAGNOSIS — N183 Chronic kidney disease, stage 3 unspecified: Secondary | ICD-10-CM

## 2015-05-16 DIAGNOSIS — I429 Cardiomyopathy, unspecified: Secondary | ICD-10-CM

## 2015-05-16 DIAGNOSIS — I428 Other cardiomyopathies: Secondary | ICD-10-CM

## 2015-05-16 DIAGNOSIS — I5021 Acute systolic (congestive) heart failure: Secondary | ICD-10-CM

## 2015-05-16 MED ORDER — FUROSEMIDE 40 MG PO TABS: ORAL_TABLET | ORAL | Status: DC

## 2015-05-16 NOTE — Patient Instructions (Addendum)
Medication Instructions:   START TAKING 80 MG LASIX IN AM AND 40 MG IN PM     If you need a refill on your cardiac medications before your next appointment, please call your pharmacy.  Labwork:  BMET TODAY   AND RETURN ON 05/23/15  FOR REPEAT BMET  LAB    Testing/Procedures:  NONE ORDER TODAY    Follow-Up:  WITH LAURA INGOLD ON FLEX 05/23/15    Any Other Special Instructions Will Be Listed Below (If Applicable).  YOUR RECOMMENDED  3 1/2 LITERS OF WATER A DAY WHICH SHOULD BE ALL LIQUIDS   CONTINUE TO MONITOR DAILY WEIGHT  Low-Sodium Eating Plan Sodium raises blood pressure and causes water to be held in the body. Getting less sodium from food will help lower your blood pressure, reduce any swelling, and protect your heart, liver, and kidneys. We get sodium by adding salt (sodium chloride) to food. Most of our sodium comes from canned, boxed, and frozen foods. Restaurant foods, fast foods, and pizza are also very high in sodium. Even if you take medicine to lower your blood pressure or to reduce fluid in your body, getting less sodium from your food is important. WHAT IS MY PLAN? Most people should limit their sodium intake to 2,300 mg a day. Your health care provider recommends that you limit your sodium intake to __________ a day.  WHAT DO I NEED TO KNOW ABOUT THIS EATING PLAN? For the low-sodium eating plan, you will follow these general guidelines:  Choose foods with a % Daily Value for sodium of less than 5% (as listed on the food label).   Use salt-free seasonings or herbs instead of table salt or sea salt.   Check with your health care provider or pharmacist before using salt substitutes.   Eat fresh foods.  Eat more vegetables and fruits.  Limit canned vegetables. If you do use them, rinse them well to decrease the sodium.   Limit cheese to 1 oz (28 g) per day.   Eat lower-sodium products, often labeled as "lower sodium" or "no salt added."  Avoid foods that  contain monosodium glutamate (MSG). MSG is sometimes added to [place] food and some canned foods.  Check food labels (Nutrition Facts labels) on foods to learn how much sodium is in one serving.  Eat more home-cooked food and less restaurant, buffet, and fast food.  When eating at a restaurant, ask that your food be prepared with less salt, or no salt if possible.  HOW DO I READ FOOD LABELS FOR SODIUM INFORMATION? The Nutrition Facts label lists the amount of sodium in one serving of the food. If you eat more than one serving, you must multiply the listed amount of sodium by the number of servings. Food labels may also identify foods as:  Sodium free--Less than 5 mg in a serving.  Very low sodium--35 mg or less in a serving.  Low sodium--140 mg or less in a serving.  Light in sodium--50% less sodium in a serving. For example, if a food that usually has 300 mg of sodium is changed to become light in sodium, it will have 150 mg of sodium.  Reduced sodium--25% less sodium in a serving. For example, if a food that usually has 400 mg of sodium is changed to reduced sodium, it will have 300 mg of sodium. WHAT FOODS CAN I EAT? Grains Low-sodium cereals, including oats, puffed wheat and rice, and shredded wheat cereals. Low-sodium crackers. Unsalted rice and pasta. Lower-sodium  bread.  Vegetables Frozen or fresh vegetables. Low-sodium or reduced-sodium canned vegetables. Low-sodium or reduced-sodium tomato sauce and paste. Low-sodium or reduced-sodium tomato and vegetable juices.  Fruits Fresh, frozen, and canned fruit. Fruit juice.  Meat and Other Protein Products Low-sodium canned tuna and salmon. Fresh or frozen meat, poultry, seafood, and fish. Lamb. Unsalted nuts. Dried beans, peas, and lentils without added salt. Unsalted canned beans. Homemade soups without salt. Eggs.  Dairy Milk. Soy milk. Ricotta cheese. Low-sodium or reduced-sodium cheeses. Yogurt.  Condiments Fresh and  dried herbs and spices. Salt-free seasonings. Onion and garlic powders. Low-sodium varieties of mustard and ketchup. Fresh or refrigerated horseradish. Lemon juice.  Fats and Oils Reduced-sodium salad dressings. Unsalted butter.  Other Unsalted popcorn and pretzels.  The items listed above may not be a complete list of recommended foods or beverages. Contact your dietitian for more options. WHAT FOODS ARE NOT RECOMMENDED? Grains Instant hot cereals. Bread stuffing, pancake, and biscuit mixes. Croutons. Seasoned rice or pasta mixes. Noodle soup cups. Boxed or frozen macaroni and cheese. Self-rising flour. Regular salted crackers. Vegetables Regular canned vegetables. Regular canned tomato sauce and paste. Regular tomato and vegetable juices. Frozen vegetables in sauces. Salted [place] fries. Olives. Rosita Fire. Relishes. Sauerkraut. Salsa. Meat and Other Protein Products Salted, canned, smoked, spiced, or pickled meats, seafood, or fish. Bacon, ham, sausage, hot dogs, corned beef, chipped beef, and packaged luncheon meats. Salt pork. Jerky. Pickled herring. Anchovies, regular canned tuna, and sardines. Salted nuts. Dairy Processed cheese and cheese spreads. Cheese curds. Blue cheese and cottage cheese. Buttermilk.  Condiments Onion and garlic salt, seasoned salt, table salt, and sea salt. Canned and packaged gravies. Worcestershire sauce. Tartar sauce. Barbecue sauce. Teriyaki sauce. Soy sauce, including reduced sodium. Steak sauce. Fish sauce. Oyster sauce. Cocktail sauce. Horseradish that you find on the shelf. Regular ketchup and mustard. Meat flavorings and tenderizers. Bouillon cubes. Hot sauce. Tabasco sauce. Marinades. Taco seasonings. Relishes. Fats and Oils Regular salad dressings. Salted butter. Margarine. Ghee. Bacon fat.  Other Potato and tortilla chips. Corn chips and puffs. Salted popcorn and pretzels. Canned or dried soups. Pizza. Frozen entrees and pot pies.  The items  listed above may not be a complete list of foods and beverages to avoid. Contact your dietitian for more information.   This information is not intended to replace advice given to you by your health care provider. Make sure you discuss any questions you have with your health care provider.   Document Released: 10/02/2001 Document Revised: 05/03/2014 Document Reviewed: 02/14/2013 Elsevier Interactive Patient Education [employer]

## 2015-05-16 NOTE — Progress Notes (Signed)
Cardiology Office Note   Date:  05/16/2015   ID:  Michele Caldwell, DOB October 05, 1934, MRN [id number]  PCP:  No PCP Per Patient  Cardiologist:  Dr Graciela Husbands, Dr Georgiann Hahn, PA-C   No chief complaint on file.   History of Present Illness: Michele Caldwell is a 81 y.o. female with a history of NICM, LBBB, s/p BSci ICD. EF 15% 2011 w/ mod MR, CKD w/ baseline Cr 1.6.  Seen in office 1/16 and was volume overloaded, wt up 11 lbs, DOE, LE edema. Lasix increased, BUN/Cr 40/1.55. BNP 2995.5. F/u at the end of the week.   Michele Caldwell presents for further evaluation and treatment of her acute diastolic CHF.   Michele Caldwell has really worked on compliance since her office visit on Monday. She is NOT happy about a low-sodium diet, not being able to use the fatback another flavorings that she normally uses, not being able to eat bacon, etc. However, she is cooking food without added sodium and is leaving out the salty and fatty flavorings she would normally use. She is not tracking fluid and says that she drinks a lot of water, but can't say how much.  She feels her shortness of breath is improved. She is aware that she still has lower extremity edema but feels it is better. She has orthopnea but does not currently have PND. She is moving around more easily. She is tracking her weight on her home scales and has lost about 2-1/2 pounds this week. She has frequent urination because of the Lasix. She is not having chest pain or palpitations.   Past Medical History  Diagnosis Date  . LBBB (left bundle branch block)   . ICD (implantable cardiac defibrillator) in place 2009    [facility] Scientific single chamber ICD  . Hypertension   . CHF (congestive heart failure) (HCC)     systolic heart failure, acute on chronic  . Chronic renal insufficiency     Past Surgical History  Procedure Laterality Date  . Cardiac catheterization  2008    nl cors, EF 20%  . Cardiac defibrillator placement  2009    [place]  Scient[employer] Vitality II ICD    Current Outpatient Prescriptions  Medication Sig Dispense Refill  . aspirin 81 MG tablet Take 81 mg by mouth daily.      . carvedilol (COREG) 12.5 MG tablet Take 1.5 tablets (18.75 mg total) by mouth 2 (two) times daily. 90 tablet 5  . furosemide (LASIX) 40 MG tablet Take 1 tablet (40 mg total) by mouth daily. 30 tablet 5  . spironolactone (ALDACTONE) 25 MG tablet Take 1 tablet (25 mg total) by mouth daily. 30 tablet 5   No current facility-administered medications for this visit.    Allergies:   Review of patient's allergies indicates no known allergies.    Social History:  The patient  reports that she has never smoked. She has never used smokeless tobacco. She reports that she does not drink alcohol.   Family History:  The patient's family history includes Cancer in her father; Heart attack in her mother; Heart disease in her mother; Heart failure in her mother; Hypertension in her mother; Kidney disease in her mother.    ROS:  Please see the history of present illness. All other systems are reviewed and negative.    PHYSICAL EXAM: VS:  BP 100/70 mmHg  Pulse 80  Ht  (1.676 m)  Wt 195 lb 6.4 oz (88.633 kg)  BMI 31.55 kg/m2 , BMI Body mass index is 31.55 kg/(m^2). GEN: Well nourished, well developed, female in no acute distress HEENT: normal for age  Neck: JVD 9 cm with positive hepatojugular reflux, no carotid bruit, murmur radiates to bilateral carotids, no masses Cardiac: RRR; 2/6 murmur, no rubs, or gallops Respiratory:  Rales bases bilaterally, normal work of breathing GI: soft, nontender, nondistended, + BS MS: no deformity or atrophy; 2+ lower extremity edema to mid thigh; distal pulses are 2+ in all 4 extremities  Skin: warm and dry, no rash Neuro:  Strength and sensation are intact Psych: euthymic mood, full affect   EKG:  EKG is ordered today. The ekg ordered today demonstrates sinus rhythm with left bundle branch block which is  old, QRS duration 208 ms; previous QRS duration 198 ms  ECHO: 01/28/2010 - Left ventricle: Marked abnormal septal motion The cavity size was  severely dilated. Wall thickness was normal. The estimated  ejection fraction was 15%. Diffuse hypokinesis. - Aortic valve: Trivial regurgitation. - Mitral valve: Moderate regurgitation. - Left atrium: The atrium was mildly dilated. - Atrial septum: No defect or patent foramen ovale was identified. - Pulmonary arteries: PA peak pressure: 7mm Hg (S).  Recent Labs: 05/12/2015: BUN 40*; Creat 1.55*; Potassium 3.7; Sodium 133*    Lipid Panel No results found for: CHOL, TRIG, HDL, CHOLHDL, VLDL, LDLCALC, LDLDIRECT   Wt Readings from Last 3 Encounters:  05/16/15 195 lb 6.4 oz (88.633 kg)  05/12/15 194 lb 12.8 oz (88.361 kg)  11/27/14 183 lb 6.4 oz (83.19 kg)     Other studies Reviewed: Additional studies/ records that were reviewed today include: Previous office notes and other records.  ASSESSMENT AND PLAN:  1.  Acute on chronic systolic CHF: Her weight is up on our scales but down slightly on her home scales. Her dietary compliance is much improved. She may be having too much fluid intake and she is to start tracking that. Fluid restrictions to 1.5 L daily. The importance of sticking to a 2000 mg sodium diet was emphasized. The fact that this is not a diet but a lifestyle change was also emphasized. The importance of maintaining her volume status by lifestyle modifications to avoid going to the hospital was also emphasized.  Her weight has increased slightly but not enough. She is compliant taking Lasix 40 mg twice a day. We will increase it 80 mg a.m. and 40 mg p.m. Hopefully she will lose about 5 pounds or more in the next week. She will come in next Friday for repeat labs and a follow-up visit with lower [facility]. She is encouraged to continue to document her weight on her home scales. Her daughter is with her today and understands the  importance of sticking to a low sodium diet.  2. Nonischemic cardiomyopathy: She is not on an ACE inhibitor or ARB because of chronic kidney disease. She is on beta blocker, diuretic and Aldactone.  3. Chronic kidney disease: We will check a BMET today and she will get one when she comes for her office visit next week.   Current medicines are reviewed at length with the patient today.  The patient does not have concerns regarding medicines.  The following changes have been made:  Increase Lasix to 80 mg a.m. and 40 mg p.m.  Labs/ tests ordered today include:   Orders Placed This Encounter  Procedures  . Basic Metabolic Panel (BMET)  . Basic Metabolic Panel (BMET)  . EKG 12-Lead  Disposition:   FU with Dr. Graciela Husbands, Nada Boozer  Signed, Leanna Battles  05/16/2015 3:39 PM    [facility] [address] [place], [place], [place]  [address] Phone: [phone]; Fax: [phone]

## 2015-05-17 LAB — BASIC METABOLIC PANEL
BUN: 44 mg/dL — ABNORMAL HIGH (ref 7–25)
CHLORIDE: 94 mmol/L — AB (ref 98–110)
CO2: 29 mmol/L (ref 20–31)
CREATININE: 1.69 mg/dL — AB (ref 0.60–0.93)
Calcium: 9.1 mg/dL (ref 8.6–10.4)
Glucose, Bld: 103 mg/dL — ABNORMAL HIGH (ref 65–99)
Potassium: 3.7 mmol/L (ref 3.5–5.3)
SODIUM: 131 mmol/L — AB (ref 135–146)

## 2015-05-23 ENCOUNTER — Ambulatory Visit: Payer: Medicare Other | Admitting: Cardiology

## 2015-05-23 ENCOUNTER — Encounter: Payer: Self-pay | Admitting: Cardiology

## 2015-05-23 ENCOUNTER — Ambulatory Visit (INDEPENDENT_AMBULATORY_CARE_PROVIDER_SITE_OTHER): Payer: Self-pay | Admitting: Cardiology

## 2015-05-23 ENCOUNTER — Other Ambulatory Visit (INDEPENDENT_AMBULATORY_CARE_PROVIDER_SITE_OTHER): Payer: Self-pay | Admitting: *Deleted

## 2015-05-23 VITALS — BP 94/60 | HR 75 | Wt 189.8 lb

## 2015-05-23 DIAGNOSIS — I5022 Chronic systolic (congestive) heart failure: Secondary | ICD-10-CM

## 2015-05-23 DIAGNOSIS — R011 Cardiac murmur, unspecified: Secondary | ICD-10-CM

## 2015-05-23 DIAGNOSIS — R0602 Shortness of breath: Secondary | ICD-10-CM

## 2015-05-23 DIAGNOSIS — Z4502 Encounter for adjustment and management of automatic implantable cardiac defibrillator: Secondary | ICD-10-CM

## 2015-05-23 DIAGNOSIS — I5021 Acute systolic (congestive) heart failure: Secondary | ICD-10-CM

## 2015-05-23 DIAGNOSIS — I429 Cardiomyopathy, unspecified: Secondary | ICD-10-CM

## 2015-05-23 LAB — BASIC METABOLIC PANEL
BUN: 43 mg/dL — ABNORMAL HIGH (ref 7–25)
CALCIUM: 8.6 mg/dL (ref 8.6–10.4)
CO2: 27 mmol/L (ref 20–31)
CREATININE: 1.65 mg/dL — AB (ref 0.60–0.93)
Chloride: 99 mmol/L (ref 98–110)
Glucose, Bld: 105 mg/dL — ABNORMAL HIGH (ref 65–99)
Potassium: 3.6 mmol/L (ref 3.5–5.3)
SODIUM: 136 mmol/L (ref 135–146)

## 2015-05-23 MED ORDER — FUROSEMIDE 40 MG PO TABS: ORAL_TABLET | ORAL | Status: DC

## 2015-05-23 NOTE — Patient Instructions (Addendum)
Medication Instructions:  Your physician has recommended you make the following change in your medication:  1-Lasix 80 mg by mouth in AM  Labwork: Your physician recommends that you have labs today BMET and BNP  Testing/Procedures: Your physician has requested that you have an echocardiogram. Echocardiography is a painless test that uses sound waves to create images of your heart. It provides your doctor with information about the size and shape of your heart and how well your heart's chambers and valves are working. This procedure takes approximately one hour. There are no restrictions for this procedure.  Follow-Up: Your physician recommends that you schedule a follow-up appointment in: 2 weeks with APP.  If you need a refill on your cardiac medications before your next appointment, please call your pharmacy.   Weigh daily Call [phone]  if weight climbs more than 3 pounds in a day or 5 pounds in a week. No salt to very little salt in your diet.  No more than 2000 mg in a day. Call if increased shortness of breath or increased swelling.     Low-Sodium Eating Plan Sodium raises blood pressure and causes water to be held in the body. Getting less sodium from food will help lower your blood pressure, reduce any swelling, and protect your heart, liver, and kidneys. We get sodium by adding salt (sodium chloride) to food. Most of our sodium comes from canned, boxed, and frozen foods. Restaurant foods, fast foods, and pizza are also very high in sodium. Even if you take medicine to lower your blood pressure or to reduce fluid in your body, getting less sodium from your food is important. WHAT IS MY PLAN? Most people should limit their sodium intake to 2,300 mg a day. Your health care provider recommends that you limit your sodium intake to __________ a day.  WHAT DO I NEED TO KNOW ABOUT THIS EATING PLAN? For the low-sodium eating plan, you will follow these general guidelines:  Choose  foods with a % Daily Value for sodium of less than 5% (as listed on the food label).   Use salt-free seasonings or herbs instead of table salt or sea salt.   Check with your health care provider or pharmacist before using salt substitutes.   Eat fresh foods.  Eat more vegetables and fruits.  Limit canned vegetables. If you do use them, rinse them well to decrease the sodium.   Limit cheese to 1 oz (28 g) per day.   Eat lower-sodium products, often labeled as "lower sodium" or "no salt added."  Avoid foods that contain monosodium glutamate (MSG). MSG is sometimes added to [place] food and some canned foods.  Check food labels (Nutrition Facts labels) on foods to learn how much sodium is in one serving.  Eat more home-cooked food and less restaurant, buffet, and fast food.  When eating at a restaurant, ask that your food be prepared with less salt, or no salt if possible.  HOW DO I READ FOOD LABELS FOR SODIUM INFORMATION? The Nutrition Facts label lists the amount of sodium in one serving of the food. If you eat more than one serving, you must multiply the listed amount of sodium by the number of servings. Food labels may also identify foods as:  Sodium free--Less than 5 mg in a serving.  Very low sodium--35 mg or less in a serving.  Low sodium--140 mg or less in a serving.  Light in sodium--50% less sodium in a serving. For example, if a food that  usually has 300 mg of sodium is changed to become light in sodium, it will have 150 mg of sodium.  Reduced sodium--25% less sodium in a serving. For example, if a food that usually has 400 mg of sodium is changed to reduced sodium, it will have 300 mg of sodium. WHAT FOODS CAN I EAT? Grains Low-sodium cereals, including oats, puffed wheat and rice, and shredded wheat cereals. Low-sodium crackers. Unsalted rice and pasta. Lower-sodium bread.  Vegetables Frozen or fresh vegetables. Low-sodium or reduced-sodium canned  vegetables. Low-sodium or reduced-sodium tomato sauce and paste. Low-sodium or reduced-sodium tomato and vegetable juices.  Fruits Fresh, frozen, and canned fruit. Fruit juice.  Meat and Other Protein Products Low-sodium canned tuna and salmon. Fresh or frozen meat, poultry, seafood, and fish. Lamb. Unsalted nuts. Dried beans, peas, and lentils without added salt. Unsalted canned beans. Homemade soups without salt. Eggs.  Dairy Milk. Soy milk. Ricotta cheese. Low-sodium or reduced-sodium cheeses. Yogurt.  Condiments Fresh and dried herbs and spices. Salt-free seasonings. Onion and garlic powders. Low-sodium varieties of mustard and ketchup. Fresh or refrigerated horseradish. Lemon juice.  Fats and Oils Reduced-sodium salad dressings. Unsalted butter.  Other Unsalted popcorn and pretzels.  The items listed above may not be a complete list of recommended foods or beverages. Contact your dietitian for more options. WHAT FOODS ARE NOT RECOMMENDED? Grains Instant hot cereals. Bread stuffing, pancake, and biscuit mixes. Croutons. Seasoned rice or pasta mixes. Noodle soup cups. Boxed or frozen macaroni and cheese. Self-rising flour. Regular salted crackers. Vegetables Regular canned vegetables. Regular canned tomato sauce and paste. Regular tomato and vegetable juices. Frozen vegetables in sauces. Salted [place] fries. Olives. Rosita Fire. Relishes. Sauerkraut. Salsa. Meat and Other Protein Products Salted, canned, smoked, spiced, or pickled meats, seafood, or fish. Bacon, ham, sausage, hot dogs, corned beef, chipped beef, and packaged luncheon meats. Salt pork. Jerky. Pickled herring. Anchovies, regular canned tuna, and sardines. Salted nuts. Dairy Processed cheese and cheese spreads. Cheese curds. Blue cheese and cottage cheese. Buttermilk.  Condiments Onion and garlic salt, seasoned salt, table salt, and sea salt. Canned and packaged gravies. Worcestershire sauce. Tartar sauce. Barbecue  sauce. Teriyaki sauce. Soy sauce, including reduced sodium. Steak sauce. Fish sauce. Oyster sauce. Cocktail sauce. Horseradish that you find on the shelf. Regular ketchup and mustard. Meat flavorings and tenderizers. Bouillon cubes. Hot sauce. Tabasco sauce. Marinades. Taco seasonings. Relishes. Fats and Oils Regular salad dressings. Salted butter. Margarine. Ghee. Bacon fat.  Other Potato and tortilla chips. Corn chips and puffs. Salted popcorn and pretzels. Canned or dried soups. Pizza. Frozen entrees and pot pies.  The items listed above may not be a complete list of foods and beverages to avoid. Contact your dietitian for more information.   This information is not intended to replace advice given to you by your health care provider. Make sure you discuss any questions you have with your health care provider.   Document Released: 10/02/2001 Document Revised: 05/03/2014 Document Reviewed: 02/14/2013 Elsevier Interactive Patient Education [employer]

## 2015-05-23 NOTE — Progress Notes (Signed)
Cardiology Office Note   Date:  05/23/2015   ID:  Michele Caldwell, DOB 03-Nov-1934, MRN [id number]  PCP:  No PCP Per Patient  Cardiologist:  Dr. Priscille Loveless     Chief Complaint  Patient presents with  . Congestive Heart Failure      History of Present Illness: Michele Caldwell is a 81 y.o. female who presents for shortness of breath and acute diastolic HF.    She has a history of NICM, LBBB, s/p BSci ICD. EF 15% 2011 w/ mod MR, CKD w/ baseline Cr 1.6.  Seen in office 1/17 and was volume overloaded, wt up 11 lbs, DOE, LE edema. Lasix increased, BUN/Cr 40/1.55. BNP 2995.5.    Seen again 05/16/15 with 2.5 lb wt loss.  Overall felt better. Her lasix changed to 80 mg in AM and 40 mg in PM.  She is here for labs and follow up.  Last week labs with Cr to 1.69 from 1.55.  Na down to 131 from 133.    Today she feels much better.  Her breathing is much improved but a little shortness of breath.  This week she was able to cook.  She has cut out salt completely - is afraid to eat much of anything.  No chest pain.  Her wt on our scales is down 6 lbs.     Past Medical History  Diagnosis Date  . LBBB (left bundle branch block)   . ICD (implantable cardiac defibrillator) in place 2009    [facility] [employer] single chamber ICD  . Hypertension   . CHF (congestive heart failure) (HCC)     systolic heart failure, acute on chronic  . Chronic renal insufficiency     Past Surgical History  Procedure Laterality Date  . Cardiac catheterization  2008    nl cors, EF 20%  . Cardiac defibrillator placement  2009    [employer] Vitality II ICD     Current Outpatient Prescriptions  Medication Sig Dispense Refill  . aspirin 81 MG tablet Take 81 mg by mouth daily.      . carvedilol (COREG) 12.5 MG tablet Take 1.5 tablets (18.75 mg total) by mouth 2 (two) times daily. 90 tablet 5  . furosemide (LASIX) 40 MG tablet TAKE 80 MG IN AM 90 tablet 2  . spironolactone (ALDACTONE) 25 MG tablet Take 1 tablet  (25 mg total) by mouth daily. 30 tablet 5   No current facility-administered medications for this visit.    Allergies:   Review of patient's allergies indicates no known allergies.    Social History:  The patient  reports that she has never smoked. She has never used smokeless tobacco. She reports that she does not drink alcohol.   Family History:  The patient's family history includes Cancer in her father; Heart attack in her mother; Heart disease in her mother; Heart failure in her mother; Hypertension in her mother; Kidney disease in her mother.    ROS:  General:no colds or fevers, 6 lb weight decrease Skin:no rashes or ulcers HEENT:no blurred vision, no congestion CV:see HPI PUL:see HPI GI:no diarrhea constipation or melena, no indigestion GU:no hematuria, no dysuria MS:no joint pain, no claudication, 1-2+ lower ext edema Neuro:no syncope, no lightheadedness Endo:no diabetes, no thyroid disease  Wt Readings from Last 3 Encounters:  05/23/15 189 lb 12.8 oz (86.093 kg)  05/16/15 195 lb 6.4 oz (88.633 kg)  05/12/15 194 lb 12.8 oz (88.361 kg)     PHYSICAL EXAM: VS:  BP  94/60 mmHg  Pulse 75  Wt 189 lb 12.8 oz (86.093 kg)  SpO2 91% , BMI Body mass index is 30.65 kg/(m^2). General:Pleasant affect, NAD Skin:Warm and dry, brisk capillary refill HEENT:normocephalic, sclera clear, mucus membranes moist Neck:supple, no JVD, no bruits  Heart:S1S2 RRR with 2/6 systolic murmur, no gallup, rub or click Lungs:clear without rales, rhonchi, or wheezes MBE:MLJQ, non tender, + BS, do not palpate liver spleen or masses Ext:1-2+ lower ext edema, 2+ radial pulses Neuro:alert and oriented X 3, MAE, follows commands, + facial symmetry    EKG:  EKG is NOT ordered today.    Recent Labs: 05/16/2015: BUN 44*; Creat 1.69*; Potassium 3.7; Sodium 131*    Lipid Panel No results found for: CHOL, TRIG, HDL, CHOLHDL, VLDL, LDLCALC, LDLDIRECT     Other studies Reviewed: Additional studies/  records that were reviewed today include: previous OV notes, echo, EKGs..   ASSESSMENT AND PLAN:  1.  Acute on chronic systolic HF.  Much improved wt is down 6 lbs.  SOB has significantly improved.  Will decrease lasix to 80 mg daily.  Prior to this bout she was on 40 mg daily.  Will check BMP today and BNP.  2. NICM with EF - will check echo with harsh murmur.  (ICD in placed no discharges.)  3. CKD -3   Discussed salt and diet.  She will wear support stockings daily as well.  She will call if her wt increases.  She will follow up in 2 weeks.  She will see Dr. Graciela Husbands in March for her ICD.   Current medicines are reviewed with the patient today.  The patient Has no concerns regarding medicines.  The following changes have been made:  See above Labs/ tests ordered today include:see above  Disposition:   FU:  see above  Nyoka Lint, NP  05/23/2015 4:35 PM    [facility] [address] [place], [place], [place]  [address]/ [address] [place], [place] Phone: [phone]; Fax: [phone]  [phone]

## 2015-05-24 LAB — BRAIN NATRIURETIC PEPTIDE: BRAIN NATRIURETIC PEPTIDE: 6298 pg/mL — AB (ref 0.0–100.0)

## 2015-05-27 ENCOUNTER — Telehealth: Payer: Self-pay | Admitting: Internal Medicine

## 2015-05-27 NOTE — Telephone Encounter (Signed)
Mrs. Gonzaga is calling to get her lab results .[name]

## 2015-05-29 NOTE — Progress Notes (Signed)
Thank you, I'm glad she's better.

## 2015-05-30 ENCOUNTER — Telehealth: Payer: Self-pay

## 2015-05-30 NOTE — Telephone Encounter (Signed)
Left message to call back. Calling patient about lab results. Per Nada Boozer NP, lab are stable.

## 2015-06-08 NOTE — Progress Notes (Signed)
Cardiology Office Note:    Date:  06/09/2015   ID:  Michele Caldwell, DOB 1934-08-06, MRN [id number]  PCP:  No PCP Per Patient  Cardiologist:  Dr. Sherryl Manges   Electrophysiologist:  Dr. Sherryl Manges   Chief Complaint  Patient presents with  . Congestive Heart Failure    Follow up    History of Present Illness:     Michele Caldwell is a 81 y.o. female with a hx of HFrEF secondary to NICM, LBBB, s/p AICD, CKD. LHC in 2008 demonstrated no CAD.  Echo in 2011 with EF 15%.  She was seen by Dr. Sherryl Manges in 8/16.  She returned to see Robbie Lis, PA-C 05/12/15 with volume overload. Her weight was up 11 lbs and she admitted to dietary indiscretion with salt.  Her Lasix was adjusted.  Since then, she has been seen by 2 different providers on 2 different occasions.  Last seen in this office 1/27 by Nada Boozer, NP.  Weight was improved and her breathing was better.  Her Lasix dose was decreased.  Echo has been ordered and she had this done today prior to seeing me.    She is here with her granddaughter who is a Charity fundraiser at [employer].  She is concerned that the patient should be admitted for CHF. The patient seems to downplay her symptoms.  She denies dyspnea.  However, her granddaughter notes that she has been more SOB.  She notes + orthopnea.  Denies PND.  LE edema is much worse.  Denies syncope.  Denies fever, vomiting, diarrhea, melena, hematochezia.     Past Medical History  Diagnosis Date  . LBBB (left bundle branch block)   . ICD (implantable cardiac defibrillator) in place 2009    [facility] Scientific single chamber ICD  . Hypertension   . CHF (congestive heart failure) (HCC)     systolic heart failure, acute on chronic  . Chronic renal insufficiency     Past Surgical History  Procedure Laterality Date  . Cardiac catheterization  2008    nl cors, EF 20%  . Cardiac defibrillator placement  2009    [employer] Vitality II ICD    Current Medications: Outpatient Prescriptions Prior  to Visit  Medication Sig Dispense Refill  . aspirin 81 MG tablet Take 81 mg by mouth daily.      . carvedilol (COREG) 12.5 MG tablet Take 1.5 tablets (18.75 mg total) by mouth 2 (two) times daily. 90 tablet 5  . spironolactone (ALDACTONE) 25 MG tablet Take 1 tablet (25 mg total) by mouth daily. 30 tablet 5  . furosemide (LASIX) 40 MG tablet TAKE 80 MG IN AM (Patient not taking: Reported on 06/09/2015) 90 tablet 2   No facility-administered medications prior to visit.     Allergies:   Review of patient's allergies indicates no known allergies.   Social History   Social History  . Marital Status: Single    Spouse Name: N/A  . Number of Children: N/A  . Years of Education: N/A   Occupational History  . Retired    Social History Main Topics  . Smoking status: Never Smoker   . Smokeless tobacco: Never Used  . Alcohol Use: No  . Drug Use: None  . Sexual Activity: Not Asked   Other Topics Concern  . None   Social History Narrative     Family History:  The patient's family history includes Cancer in her father; Heart attack in her mother; Heart disease in her  mother; Heart failure in her mother; Hypertension in her mother; Kidney disease in her mother.   ROS:   Please see the history of present illness.    Review of Systems  Constitution: Positive for malaise/fatigue and weight gain.  Cardiovascular: Positive for dyspnea on exertion and leg swelling.  Musculoskeletal: Positive for joint pain.  Neurological: Positive for loss of balance.  All other systems reviewed and are negative.   Physical Exam:    VS:  BP 84/50 mmHg  Pulse 86  Ht 5\' 6"  (1.676 m)  Wt 199 lb 12.8 oz (90.629 kg)  BMI 32.26 kg/m2   GEN: Well nourished, well developed, in no acute distress HEENT: normal Neck: JVP up to the jaw at 90 degrees, no masses Cardiac: Normal S1/S2, RRR; 2-3/6 holosystolic murmur LLSB/apex, tight 2+ bilateral LE edema up to the groin; Respiratory: decrease breath sounds  bilaterally, ? Egophony at the bases, no wheezing, no rales GI: soft, nontender, + mildly distended  MS: no deformity   Skin: warm and dry  Neuro:   no focal deficits  Psych: Alert and oriented x 3, normal affect  Wt Readings from Last 3 Encounters:  06/09/15 199 lb 12.8 oz (90.629 kg)  05/23/15 189 lb 12.8 oz (86.093 kg)  05/16/15 195 lb 6.4 oz (88.633 kg)      Studies/Labs Reviewed:     EKG:  EKG is not ordered today.  The ekg ordered today demonstrates n/a  Recent Labs: 05/23/2015: BUN 43*; Creat 1.65*; Potassium 3.6; Sodium 136   Recent Lipid Panel No results found for: CHOL, TRIG, HDL, CHOLHDL, VLDL, LDLCALC, LDLDIRECT  Additional studies/ records that were reviewed today include:   Echo 10/11 EF 15%, diff HK, trivial AI, mod MR, mild LAE, PASP 35 mmHg  LHC 3/08 CONCLUSION: 1. Normal coronary angiography. 2. Severe nonischemic cardiomyopathy with an ejection fraction estimated at 20% and a reduced cardiac index.  ASSESSMENT:     1. Acute on chronic systolic CHF (congestive heart failure) (HCC)   2. NICM (nonischemic cardiomyopathy) (HCC)   3. Benign hypertensive heart disease with heart failure (HCC)   4. RENAL DISEASE, CHRONIC, STAGE II   5. Mitral regurgitation     PLAN:     In order of problems listed above:  1. Acute on Chronic Systolic CHF - She is massively volume overloaded.  Weight is up 10 lbs since last seen.  She apparently had a hard time lying flat getting her echo today.  She has had minimal response over the past few weeks to increasing doses of oral diuretics.  I have recommended admission to the hospital for IV diuresis.  She was also seen by Dr. Hillis Range (DOD) who agreed.    -  Admit to Tele  -  Start Lasix 60 mg IV bid  -  BP is soft >> decrease Coreg to 6.25 mg bid, Spironolactone to 12.5 mg QD  -  Consider HF Team consult if volume difficult to manage.   2. NICM - She was taken off of ACE inhibitor due to cough.  Her granddaughter  notes a hx of angioedema as well.  I am not sure that challenging her at some point with an angiotensin receptor blocker is a great idea.  May need to consider adding Hydralazine/Nitrates as BP will allow.  Continue beta-blocker but decrease dose as noted above. Continue Spironolactone but decrease dose as noted above.  Echo from today is pending.    3. HTN Heart Disease - BP  is low today.  Adjust medications as noted.   4. CKD - Will need to keep a close eye on renal function and K+ with IV diuresis.    5. Mitral Regurgitation - Mod MR by echo in 2011.  Murmur on exam is loud.  Question if MR has gotten worse contributing to her acute HF.  Echo from today is currently pending.    6. S/p ICD - No ICD shocks noted.  FU with EP as planned.    Medication Adjustments/Labs and Tests Ordered: Current medicines are reviewed at length with the patient today.  Concerns regarding medicines are outlined above.  Medication changes, Labs and Tests ordered today are outlined in the Patient Instructions noted below. Patient Instructions  YOU HAVE BEEN ADMITTED TO [place]  [facility]    Signed, Tereso Newcomer, PA-C  06/09/2015 5:01 PM    [facility] [address] [place], [place], [place]  [address] Phone: [phone]; Fax: [phone]

## 2015-06-09 ENCOUNTER — Ambulatory Visit (INDEPENDENT_AMBULATORY_CARE_PROVIDER_SITE_OTHER): Payer: Self-pay | Admitting: Physician Assistant

## 2015-06-09 ENCOUNTER — Ambulatory Visit (HOSPITAL_BASED_OUTPATIENT_CLINIC_OR_DEPARTMENT_OTHER): Payer: Medicare Other

## 2015-06-09 ENCOUNTER — Inpatient Hospital Stay (HOSPITAL_COMMUNITY)
Admission: AD | Admit: 2015-06-09 | Discharge: 2015-06-18 | DRG: 291 | Disposition: A | Discharge status: Home Health | Payer: Medicare Other | Source: Ambulatory Visit | Attending: Internal Medicine | Admitting: Internal Medicine

## 2015-06-09 ENCOUNTER — Telehealth: Payer: Self-pay | Admitting: Physician Assistant

## 2015-06-09 ENCOUNTER — Encounter: Payer: Self-pay | Admitting: Physician Assistant

## 2015-06-09 VITALS — BP 84/50 | HR 86 | Ht 66.0 in | Wt 199.8 lb

## 2015-06-09 DIAGNOSIS — E875 Hyperkalemia: Secondary | ICD-10-CM | POA: Diagnosis present

## 2015-06-09 DIAGNOSIS — M109 Gout, unspecified: Secondary | ICD-10-CM | POA: Diagnosis present

## 2015-06-09 DIAGNOSIS — I340 Nonrheumatic mitral (valve) insufficiency: Secondary | ICD-10-CM | POA: Diagnosis present

## 2015-06-09 DIAGNOSIS — R011 Cardiac murmur, unspecified: Secondary | ICD-10-CM

## 2015-06-09 DIAGNOSIS — N183 Chronic kidney disease, stage 3 (moderate): Secondary | ICD-10-CM | POA: Diagnosis present

## 2015-06-09 DIAGNOSIS — Z95810 Presence of automatic (implantable) cardiac defibrillator: Secondary | ICD-10-CM | POA: Diagnosis not present

## 2015-06-09 DIAGNOSIS — E8770 Fluid overload, unspecified: Secondary | ICD-10-CM | POA: Diagnosis present

## 2015-06-09 DIAGNOSIS — N182 Chronic kidney disease, stage 2 (mild): Secondary | ICD-10-CM

## 2015-06-09 DIAGNOSIS — I5043 Acute on chronic combined systolic (congestive) and diastolic (congestive) heart failure: Secondary | ICD-10-CM | POA: Insufficient documentation

## 2015-06-09 DIAGNOSIS — Z79899 Other long term (current) drug therapy: Secondary | ICD-10-CM | POA: Diagnosis not present

## 2015-06-09 DIAGNOSIS — I119 Hypertensive heart disease without heart failure: Secondary | ICD-10-CM

## 2015-06-09 DIAGNOSIS — I429 Cardiomyopathy, unspecified: Secondary | ICD-10-CM | POA: Diagnosis present

## 2015-06-09 DIAGNOSIS — I110 Hypertensive heart disease with heart failure: Secondary | ICD-10-CM

## 2015-06-09 DIAGNOSIS — I509 Heart failure, unspecified: Secondary | ICD-10-CM

## 2015-06-09 DIAGNOSIS — Z7982 Long term (current) use of aspirin: Secondary | ICD-10-CM

## 2015-06-09 DIAGNOSIS — I428 Other cardiomyopathies: Secondary | ICD-10-CM

## 2015-06-09 DIAGNOSIS — I5023 Acute on chronic systolic (congestive) heart failure: Secondary | ICD-10-CM

## 2015-06-09 DIAGNOSIS — I130 Hypertensive heart and chronic kidney disease with heart failure and stage 1 through stage 4 chronic kidney disease, or unspecified chronic kidney disease: Principal | ICD-10-CM | POA: Diagnosis present

## 2015-06-09 DIAGNOSIS — E871 Hypo-osmolality and hyponatremia: Secondary | ICD-10-CM | POA: Diagnosis present

## 2015-06-09 DIAGNOSIS — N179 Acute kidney failure, unspecified: Secondary | ICD-10-CM | POA: Diagnosis present

## 2015-06-09 DIAGNOSIS — Z452 Encounter for adjustment and management of vascular access device: Secondary | ICD-10-CM

## 2015-06-09 DIAGNOSIS — I5021 Acute systolic (congestive) heart failure: Secondary | ICD-10-CM

## 2015-06-09 HISTORY — DX: Hypertensive heart disease without heart failure: I11.9

## 2015-06-09 HISTORY — DX: Nonrheumatic mitral (valve) insufficiency: I34.0

## 2015-06-09 LAB — CBC WITH DIFFERENTIAL/PLATELET
BASOS ABS: 0 10*3/uL (ref 0.0–0.1)
BASOS PCT: 0 %
EOS ABS: 0.1 10*3/uL (ref 0.0–0.7)
Eosinophils Relative: 2 %
HEMATOCRIT: 36.1 % (ref 36.0–46.0)
HEMOGLOBIN: 11.6 g/dL — AB (ref 12.0–15.0)
Lymphocytes Relative: 23 %
Lymphs Abs: 1.1 10*3/uL (ref 0.7–4.0)
MCH: 27.6 pg (ref 26.0–34.0)
MCHC: 32.1 g/dL (ref 30.0–36.0)
MCV: 86 fL (ref 78.0–100.0)
Monocytes Absolute: 0.4 10*3/uL (ref 0.1–1.0)
Monocytes Relative: 9 %
NEUTROS ABS: 3.1 10*3/uL (ref 1.7–7.7)
NEUTROS PCT: 66 %
Platelets: 162 10*3/uL (ref 150–400)
RBC: 4.2 MIL/uL (ref 3.87–5.11)
RDW: 19.3 % — AB (ref 11.5–15.5)
WBC: 4.7 10*3/uL (ref 4.0–10.5)

## 2015-06-09 LAB — COMPREHENSIVE METABOLIC PANEL
ALBUMIN: 2.9 g/dL — AB (ref 3.5–5.0)
ALK PHOS: 62 U/L (ref 38–126)
ALT: 18 U/L (ref 14–54)
AST: 24 U/L (ref 15–41)
Anion gap: 13 (ref 5–15)
BILIRUBIN TOTAL: 1.7 mg/dL — AB (ref 0.3–1.2)
BUN: 43 mg/dL — AB (ref 6–20)
CO2: 26 mmol/L (ref 22–32)
Calcium: 8.8 mg/dL — ABNORMAL LOW (ref 8.9–10.3)
Chloride: 100 mmol/L — ABNORMAL LOW (ref 101–111)
Creatinine, Ser: 1.89 mg/dL — ABNORMAL HIGH (ref 0.44–1.00)
GFR calc Af Amer: 28 mL/min — ABNORMAL LOW (ref >60)
GFR calc non Af Amer: 24 mL/min — ABNORMAL LOW (ref >60)
GLUCOSE: 188 mg/dL — AB (ref 65–99)
POTASSIUM: 4.2 mmol/L (ref 3.5–5.1)
SODIUM: 139 mmol/L (ref 135–145)
TOTAL PROTEIN: 6.9 g/dL (ref 6.5–8.1)

## 2015-06-09 LAB — PROTIME-INR
INR: 1.36 (ref 0.00–1.49)
Prothrombin Time: 16.9 s — ABNORMAL HIGH (ref 11.6–15.2)

## 2015-06-09 LAB — MAGNESIUM: MAGNESIUM: 2.3 mg/dL (ref 1.7–2.4)

## 2015-06-09 LAB — TSH: TSH: 1.256 u[IU]/mL (ref 0.350–4.500)

## 2015-06-09 LAB — APTT: aPTT: 33 s (ref 24–37)

## 2015-06-09 LAB — BRAIN NATRIURETIC PEPTIDE: B Natriuretic Peptide: >4500 pg/mL — ABNORMAL HIGH (ref 0.0–100.0)

## 2015-06-09 MED ORDER — CARVEDILOL 6.25 MG PO TABS
6.2500 mg | ORAL_TABLET | Freq: Two times a day (BID) | ORAL | Status: DC
  Administered 2015-06-10 – 2015-06-11 (×3): 6.25 mg via ORAL
  Filled 2015-06-09 (×3): qty 1

## 2015-06-09 MED ORDER — SPIRONOLACTONE 25 MG PO TABS: 12.5000 mg | ORAL_TABLET | Freq: Every day | ORAL | Status: DC

## 2015-06-09 MED ORDER — ACETAMINOPHEN 325 MG PO TABS
650.0000 mg | ORAL_TABLET | ORAL | Status: DC | PRN
  Administered 2015-06-11 – 2015-06-18 (×6): 650 mg via ORAL
  Filled 2015-06-09 (×8): qty 2

## 2015-06-09 MED ORDER — POTASSIUM CHLORIDE CRYS ER 10 MEQ PO TBCR
10.0000 meq | EXTENDED_RELEASE_TABLET | Freq: Two times a day (BID) | ORAL | Status: DC
Start: 2015-06-09 — End: 2015-06-10
  Administered 2015-06-09: 10 meq via ORAL
  Filled 2015-06-09: qty 1

## 2015-06-09 MED ORDER — ASPIRIN 81 MG PO CHEW
81.0000 mg | CHEWABLE_TABLET | Freq: Every day | ORAL | Status: DC
  Administered 2015-06-09 – 2015-06-18 (×10): 81 mg via ORAL
  Filled 2015-06-09 (×10): qty 1

## 2015-06-09 MED ORDER — FUROSEMIDE 10 MG/ML IJ SOLN
60.0000 mg | Freq: Two times a day (BID) | INTRAMUSCULAR | Status: DC
  Administered 2015-06-09 – 2015-06-10 (×2): 60 mg via INTRAVENOUS
  Filled 2015-06-09 (×2): qty 6

## 2015-06-09 MED ORDER — SODIUM CHLORIDE 0.9% FLUSH: 3.0000 mL | INTRAVENOUS | Status: DC | PRN

## 2015-06-09 MED ORDER — ENOXAPARIN SODIUM 30 MG/0.3ML ~~LOC~~ SOLN: 30.0000 mg | Freq: Two times a day (BID) | SUBCUTANEOUS | Status: DC

## 2015-06-09 MED ORDER — SODIUM CHLORIDE 0.9 % IV SOLN: 250.0000 mL | INTRAVENOUS | Status: DC | PRN

## 2015-06-09 MED ORDER — SODIUM CHLORIDE 0.9% FLUSH
3.0000 mL | Freq: Two times a day (BID) | INTRAVENOUS | Status: DC
  Administered 2015-06-09 – 2015-06-18 (×13): 3 mL via INTRAVENOUS

## 2015-06-09 MED ORDER — ONDANSETRON HCL 4 MG/2ML IJ SOLN: 4.0000 mg | Freq: Four times a day (QID) | INTRAMUSCULAR | Status: DC | PRN

## 2015-06-09 MED ORDER — ENOXAPARIN SODIUM 30 MG/0.3ML ~~LOC~~ SOLN
30.0000 mg | SUBCUTANEOUS | Status: DC
  Administered 2015-06-09 – 2015-06-17 (×9): 30 mg via SUBCUTANEOUS
  Filled 2015-06-09 (×9): qty 0.3

## 2015-06-09 NOTE — Telephone Encounter (Signed)
They are waiting for the room number for the hospital. We disconnected before I could get her cell number.

## 2015-06-09 NOTE — Telephone Encounter (Signed)
pt and her granddaughter have been advised of room is ready. Hospital was very busy and we were waiting for a room to be cleaned. While at appt today pt has given permission ok to s/w granddaughter Frederich Chick.

## 2015-06-09 NOTE — H&P (Signed)
History and Physical:    Date:  06/09/2015   ID:  Michele Caldwell, DOB 06-27-1934, MRN [id number]  PCP:  No PCP Per Patient  Cardiologist:  Dr. Sherryl Manges   Electrophysiologist:  Dr. Sherryl Manges   Chief Complaint  Patient presents with  . Congestive Heart Failure    Follow up    History of Present Illness:     Michele Caldwell is a 81 y.o. female with a hx of HFrEF secondary to NICM, LBBB, s/p AICD, CKD. LHC in 2008 demonstrated no CAD.  Echo in 2011 with EF 15%.  She was seen by Dr. Sherryl Manges in 8/16.  She returned to see Robbie Lis, PA-C 05/12/15 with volume overload. Her weight was up 11 lbs and she admitted to dietary indiscretion with salt.  Her Lasix was adjusted.  Since then, she has been seen by 2 different providers on 2 different occasions.  Last seen in this office 1/27 by Nada Boozer, NP.  Weight was improved and her breathing was better.  Her Lasix dose was decreased.  Echo has been ordered and she had this done today prior to seeing me.    She is here with her granddaughter who is a Charity fundraiser at [employer].  She is concerned that the patient should be admitted for CHF. The patient seems to downplay her symptoms.  She denies dyspnea.  However, her granddaughter notes that she has been more SOB.  She notes + orthopnea.  Denies PND.  LE edema is much worse.  Denies syncope.  Denies fever, vomiting, diarrhea, melena, hematochezia.     Past Medical History  Diagnosis Date  . LBBB (left bundle branch block)   . ICD (implantable cardiac defibrillator) in place 2009    [facility] [employer] single chamber ICD  . Hypertension   . CHF (congestive heart failure) (HCC)     systolic heart failure, acute on chronic  . Chronic renal insufficiency     Past Surgical History  Procedure Laterality Date  . Cardiac catheterization  2008    nl cors, EF 20%  . Cardiac defibrillator placement  2009    [employer] Vitality II ICD    Current Medications: Outpatient Prescriptions Prior to  Visit  Medication Sig Dispense Refill  . aspirin 81 MG tablet Take 81 mg by mouth daily.      . carvedilol (COREG) 12.5 MG tablet Take 1.5 tablets (18.75 mg total) by mouth 2 (two) times daily. 90 tablet 5  . spironolactone (ALDACTONE) 25 MG tablet Take 1 tablet (25 mg total) by mouth daily. 30 tablet 5  . furosemide (LASIX) 40 MG tablet TAKE 80 MG IN AM (Patient not taking: Reported on 06/09/2015) 90 tablet 2   No facility-administered medications prior to visit.     Allergies:   Review of patient's allergies indicates no known allergies.   Social History   Social History  . Marital Status: Single    Spouse Name: N/A  . Number of Children: N/A  . Years of Education: N/A   Occupational History  . Retired    Social History Main Topics  . Smoking status: Never Smoker   . Smokeless tobacco: Never Used  . Alcohol Use: No  . Drug Use: None  . Sexual Activity: Not Asked   Other Topics Concern  . None   Social History Narrative     Family History:  The patient's family history includes Cancer in her father; Heart attack in her mother; Heart disease in her  mother; Heart failure in her mother; Hypertension in her mother; Kidney disease in her mother.   ROS:   Please see the history of present illness.    Review of Systems  Constitution: Positive for malaise/fatigue and weight gain.  Cardiovascular: Positive for dyspnea on exertion and leg swelling.  Musculoskeletal: Positive for joint pain.  Neurological: Positive for loss of balance.  All other systems reviewed and are negative.   Physical Exam:    VS:  BP 84/50 mmHg  Pulse 86  Ht 5\' 6"  (1.676 m)  Wt 199 lb 12.8 oz (90.629 kg)  BMI 32.26 kg/m2   GEN: Well nourished, well developed, in no acute distress HEENT: normal Neck: JVP up to the jaw at 90 degrees, no masses Cardiac: Normal S1/S2, RRR; 2-3/6 holosystolic murmur LLSB/apex, tight 2+ bilateral LE edema up to the groin; Respiratory: decrease breath sounds  bilaterally, ? Egophony at the bases, no wheezing, no rales GI: soft, nontender, + mildly distended  MS: no deformity   Skin: warm and dry  Neuro:   no focal deficits  Psych: Alert and oriented x 3, normal affect  Wt Readings from Last 3 Encounters:  06/09/15 199 lb 12.8 oz (90.629 kg)  05/23/15 189 lb 12.8 oz (86.093 kg)  05/16/15 195 lb 6.4 oz (88.633 kg)      Studies/Labs Reviewed:     EKG:  EKG is not ordered today.  The ekg ordered today demonstrates n/a  Recent Labs: 05/23/2015: BUN 43*; Creat 1.65*; Potassium 3.6; Sodium 136   Recent Lipid Panel No results found for: CHOL, TRIG, HDL, CHOLHDL, VLDL, LDLCALC, LDLDIRECT  Additional studies/ records that were reviewed today include:   Echo 10/11 EF 15%, diff HK, trivial AI, mod MR, mild LAE, PASP 35 mmHg  LHC 3/08 CONCLUSION: 1. Normal coronary angiography. 2. Severe nonischemic cardiomyopathy with an ejection fraction estimated at 20% and a reduced cardiac index.  ASSESSMENT:     1. Acute on chronic systolic CHF (congestive heart failure) (HCC)   2. NICM (nonischemic cardiomyopathy) (HCC)   3. Benign hypertensive heart disease with heart failure (HCC)   4. RENAL DISEASE, CHRONIC, STAGE II   5. Mitral regurgitation     PLAN:     In order of problems listed above:  1. Acute on Chronic Systolic CHF - She is massively volume overloaded.  Weight is up 10 lbs since last seen.  She apparently had a hard time lying flat getting her echo today.  She has had minimal response over the past few weeks to increasing doses of oral diuretics.  I have recommended admission to the hospital for IV diuresis.  She was also seen by Dr. Hillis Range (DOD) who agreed.    -  Admit to Tele  -  Start Lasix 60 mg IV bid  -  BP is soft >> decrease Coreg to 6.25 mg bid, Spironolactone to 12.5 mg QD  -  Consider HF Team consult if volume difficult to manage.   2. NICM - She was taken off of ACE inhibitor due to cough.  Her granddaughter  notes a hx of angioedema as well.  I am not sure that challenging her at some point with an angiotensin receptor blocker is a great idea.  May need to consider adding Hydralazine/Nitrates as BP will allow.  Continue beta-blocker but decrease dose as noted above. Continue Spironolactone but decrease dose as noted above.  Echo from today is pending.    3. HTN Heart Disease - BP  is low today.  Adjust medications as noted.   4. CKD - Will need to keep a close eye on renal function and K+ with IV diuresis.    5. Mitral Regurgitation - Mod MR by echo in 2011.  Murmur on exam is loud.  Question if MR has gotten worse contributing to her acute HF.  Echo from today is currently pending.    6. S/p ICD - No ICD shocks noted.  FU with EP as planned.    Medication Adjustments/Labs and Tests Ordered: Current medicines are reviewed at length with the patient today.  Concerns regarding medicines are outlined above.  Medication changes, Labs and Tests ordered today are outlined in the Patient Instructions noted below. Patient Instructions  YOU HAVE BEEN ADMITTED TO [place]  [facility]    Signed, Tereso Newcomer, PA-C  06/09/2015 5:01 PM    [facility] [address] [place], [place], [place]  [address] Phone: [phone]; Fax: [phone]

## 2015-06-09 NOTE — Patient Instructions (Addendum)
YOU HAVE BEEN ADMITTED TO [place] [facility] 

## 2015-06-10 ENCOUNTER — Other Ambulatory Visit: Payer: Self-pay

## 2015-06-10 ENCOUNTER — Encounter (HOSPITAL_COMMUNITY): Payer: Self-pay | Admitting: General Practice

## 2015-06-10 ENCOUNTER — Inpatient Hospital Stay (HOSPITAL_COMMUNITY): Payer: Medicare Other

## 2015-06-10 DIAGNOSIS — Z95810 Presence of automatic (implantable) cardiac defibrillator: Secondary | ICD-10-CM

## 2015-06-10 DIAGNOSIS — I5023 Acute on chronic systolic (congestive) heart failure: Secondary | ICD-10-CM

## 2015-06-10 DIAGNOSIS — I340 Nonrheumatic mitral (valve) insufficiency: Secondary | ICD-10-CM

## 2015-06-10 LAB — BASIC METABOLIC PANEL
Anion gap: 14 (ref 5–15)
BUN: 44 mg/dL — AB (ref 6–20)
CALCIUM: 8.7 mg/dL — AB (ref 8.9–10.3)
CO2: 25 mmol/L (ref 22–32)
CREATININE: 1.75 mg/dL — AB (ref 0.44–1.00)
Chloride: 99 mmol/L — ABNORMAL LOW (ref 101–111)
GFR calc Af Amer: 31 mL/min — ABNORMAL LOW (ref >60)
GFR, EST NON AFRICAN AMERICAN: 27 mL/min — AB (ref >60)
GLUCOSE: 140 mg/dL — AB (ref 65–99)
Potassium: 5.3 mmol/L — ABNORMAL HIGH (ref 3.5–5.1)
SODIUM: 138 mmol/L (ref 135–145)

## 2015-06-10 MED ORDER — FUROSEMIDE 10 MG/ML IJ SOLN
15.0000 mg/h | INTRAVENOUS | Status: AC
  Administered 2015-06-10: 8 mg/h via INTRAVENOUS
  Administered 2015-06-11 – 2015-06-15 (×6): 15 mg/h via INTRAVENOUS
  Filled 2015-06-10 (×18): qty 25

## 2015-06-10 NOTE — Progress Notes (Signed)
Patient Profile: Michele Caldwell is a 81 y.o. female with a hx of HFrEF secondary to NICM, LBBB, s/p AICD, CKD. LHC in 2008 demonstrated no CAD. Echo in 2011 with EF 15%. Admitted from the office 06/09/15 for acute on chronic CHF. Massively volume overloaded and symptomatic in the office. Repeat echo performed in office yesterday prior to admission. EF unchanged at 15-20%.    Subjective: Feels ok. No complaints this morning. Out of bed, sitting in chair and eating breakfast. Granddaughter by beside. Not requiring supplemental O2. She denies any resting dyspnea. Her granddaughter reports her leg swelling has improved some.   Objective: Vital signs in last 24 hours: Temp:  [97.9 F (36.6 C)-98.1 F (36.7 C)] 97.9 F (36.6 C) (02/14 0536) Pulse Rate:  [80-86] 83 (02/14 0536) Resp:  [20] 20 (02/14 0536) BP: (84-104)/(50-68) 104/57 mmHg (02/14 0536) SpO2:  [95 %-96 %] 95 % (02/14 0536) Weight:  [197 lb 12.8 oz (89.721 kg)-199 lb 12.8 oz (90.629 kg)] 198 lb 11.2 oz (90.13 kg) (02/14 0325) Last BM Date: 06/09/15  Intake/Output from previous day: 02/13 0701 - 02/14 0700 In: 240 [P.O.:240] Out: 161 [Urine:161] Intake/Output this shift:    Medications Current Facility-Administered Medications  Medication Dose Route Frequency Provider Last Rate Last Dose  . 0.9 %  sodium chloride infusion  250 mL Intravenous PRN Beatrice Lecher, PA-C      . acetaminophen (TYLENOL) tablet 650 mg  650 mg Oral Q4H PRN Beatrice Lecher, PA-C      . aspirin chewable tablet 81 mg  81 mg Oral Daily Beatrice Lecher, PA-C   81 mg at 06/09/15 2022  . carvedilol (COREG) tablet 6.25 mg  6.25 mg Oral BID WC Scott T Weaver, PA-C      . enoxaparin (LOVENOX) injection 30 mg  30 mg Subcutaneous Q24H Gwenlyn Found Carney, RPH   30 mg at 06/09/15 2023  . furosemide (LASIX) injection 60 mg  60 mg Intravenous BID Beatrice Lecher, PA-C   60 mg at 06/09/15 1900  . ondansetron (ZOFRAN) injection 4 mg  4 mg Intravenous Q6H PRN Beatrice Lecher, PA-C      . potassium chloride (K-DUR,KLOR-CON) CR tablet 10 mEq  10 mEq Oral BID Beatrice Lecher, PA-C   10 mEq at 06/09/15 2024  . sodium chloride flush (NS) 0.9 % injection 3 mL  3 mL Intravenous Q12H Beatrice Lecher, PA-C   3 mL at 06/09/15 2024  . sodium chloride flush (NS) 0.9 % injection 3 mL  3 mL Intravenous PRN Beatrice Lecher, PA-C      . spironolactone (ALDACTONE) tablet 12.5 mg  12.5 mg Oral Daily Kennon Rounds       [facility] Weights   06/09/15 1824 06/10/15 0325  Weight: 197 lb 12.8 oz (89.721 kg) 198 lb 11.2 oz (90.13 kg)    PE: General appearance: alert, cooperative and no distress Neck: no carotid bruit and elevated JVD Lungs: clear to auscultation bilaterally Heart: regular rate and rhythm and 2/6 murmur loudest at apex Extremities: 2+ bilateral LEE up to thighs Pulses: 2+ and symmetric Skin: warm and dry Neurologic: Grossly normal  Lab Results:   Recent Labs  06/09/15 1919  WBC 4.7  HGB 11.6*  HCT 36.1  PLT 162   BMET  Recent Labs  06/09/15 1919 06/10/15 0517  NA 139 138  K 4.2 5.3*  CL 100* 99*  CO2 26 25  GLUCOSE 188* 140*  BUN  43* 44*  CREATININE 1.89* 1.75*  CALCIUM 8.8* 8.7*   PT/INR  Recent Labs  06/09/15 1919  LABPROT 16.9*  INR 1.36   Cardiac Panel (last 3 results) No results for input(s): CKTOTAL, CKMB, TROPONINI, RELINDX in the last 72 hours.  Studies/Results: 2D echo 06/09/15  Study Conclusions  - Left ventricle: LVEF is severely depressed at approximately 15 to 20% with diffuse hypokinesis; inferior, inferoseptal and posterior akinesis. The cavity size was severely dilated. Wall thickness was increased in a pattern of mild LVH. - Mitral valve: There was severe regurgitation. - Left atrium: The atrium was severely dilated. - Right ventricle: Systolic function was mildly reduced. - Right atrium: The atrium was mildly dilated. - Tricuspid valve: There was moderate regurgitation. - Pulmonary arteries: PA  peak pressure: 55 mm Hg (S).  Assessment/Plan  Principal Problem:   Acute on chronic systolic CHF (congestive heart failure) (HCC) Active Problems:   Benign hypertensive heart disease with heart failure (HCC)   NICM (nonischemic cardiomyopathy) (HCC)   RENAL DISEASE, CHRONIC, STAGE II   Automatic implantable cardioverter-defibrillator in situ   Mitral regurgitation    1. Acute on Chronic Systolic CHF: total UOP overnight was only documented to be 161 ml. Doubt strict I/Os are being recorded. Will ask RN to do so today. Patient reports that she is using designiated urinal with bucket for UOP measurement. Patient denies any difficulty making it to urinal in time. No incontinence. I offered a foley to make it easier for patient but she declines at this time. If urine output collection becomes difficult will need to reconsider foley placement. She continues to have significant bilateral LEE and needs further diuresis. Her weight appears to be up 1 lb since admit at 198. BP is soft but stable. Renal function is abnormal but SCr is improving with diuresis. Continue 60 mg IV lasix BID. Strict I/Os ordered. Low sodium diet and daily weights.   2. NICM: chronically low LVF. Recent echo shows no significant change in LV systolic function with EF still at 15-20%. Cath in 2008 showed no CAD. She has an ICD and has been on medical therapy with BB and spironolactone.   3. HTN: BP is soft but stable. Her home Coreg was decreased yesterday in the office down to 6.25 mg BID and spironolactone reduced to 12.5 mg. She is also receiving 60 mg of IV lasix BID for diuresis.   4. CKD: admission SCr was 1.89. This is improved today at 1.75. Her BP is soft in the upper 90s systolic. Given soft BP, baseline renal function and need to diurese, will hold spironolactone temporarily.   5. Mitral Regurgitation: 2D echo yesterday demonstrated severe MR. Peak gradient was 8 mm Hg. The LA is severely dilated. ? If patient is a  suitable candidate for mitral valve repair, given low EF. Defer further w/u to MD.   6. AICD: implanted for low EF, <35%, in the setting of NICM. Followed by Dr. Graciela Husbands  7. Hyperkalemia: K is 5.3 today. She is on supplemental K and spironolactone. Given soft BP, baseline renal function and need to diurese, will hold spironolactone temporarily. Will hold AM dose of supplemental K, however she will likely need to continue supplemental K later given treatment with IV Lasix, to prevent hypokalemia. Repeat BMP in the am. Telemetry shows no arrhthymias.     LOS: 1 day    Brittainy M. Sharol Harness, PA-C 06/10/2015 7:22 AM

## 2015-06-10 NOTE — Progress Notes (Signed)
Heart Failure Navigator Consult Note  Presentation: Michele Caldwell  is a 81 y.o. female with a hx of HFrEF secondary to NICM, LBBB, s/p AICD, CKD. LHC in 2008 demonstrated no CAD. Echo in 2011 with EF 15%. Admitted from the office 06/09/15 for acute on chronic CHF. Massively volume overloaded and symptomatic in the office. Repeat echo performed in office yesterday prior to admission. EF unchanged at 15-20%.  Past Medical History  Diagnosis Date  . LBBB (left bundle branch block)   . ICD (implantable cardiac defibrillator) in place 2009    [facility] single chamber ICD  . Hypertension   . CHF (congestive heart failure) (HCC)     systolic heart failure, acute on chronic  . Chronic renal insufficiency     Social History   Social History  . Marital Status: Single    Spouse Name: N/A  . Number of Children: N/A  . Years of Education: N/A   Occupational History  . Retired    Social History Main Topics  . Smoking status: Never Smoker   . Smokeless tobacco: Never Used  . Alcohol Use: No  . Drug Use: No  . Sexual Activity: Not Asked   Other Topics Concern  . None   Social History Narrative    ECHO:Study Conclusions--06/09/15  - Left ventricle: LVEF is severely depressed at approximately 15 to 20% with diffuse hypokinesis; inferior, inferoseptal and posterior akinesis. The cavity size was severely dilated. Wall thickness was increased in a pattern of mild LVH. - Mitral valve: There was severe regurgitation. - Left atrium: The atrium was severely dilated. - Right ventricle: Systolic function was mildly reduced. - Right atrium: The atrium was mildly dilated. - Tricuspid valve: There was moderate regurgitation. - Pulmonary arteries: PA peak pressure: 55 mm Hg (S).  Transthoracic echocardiography. M-mode, complete 2D, spectral Doppler, and color Doppler. Birthdate: Patient birthdate: July 24, 1934. Age: Patient is 81 yr old. Sex: Gender: female. BMI: 30.5  kg/m^2. Blood pressure:   94/60 Patient status: Outpatient. Study date: Study date: 06/09/2015. Study time: 01:25 PM. Location: [place] [facility]  BNP    Component Value Date/Time   BNP >4500.0* 06/09/2015 1920    ProBNP    Component Value Date/Time   PROBNP 475.0* 04/15/2009 1441     Education Assessment and Provision:  Detailed education and instructions provided on heart failure disease management including the following:  Signs and symptoms of Heart Failure When to call the physician Importance of daily weights Low sodium diet Fluid restriction Medication management Anticipated future follow-up appointments  Patient education given on each of the above topics.  Patient acknowledges understanding and acceptance of all instructions.  I spoke with Ms. Prins regarding her HF.  She tells me that she has had education before.  She can teach back most topics listed above.  She does have a scale at home and says she has been weighing each day.  She does admit that recently she has been adding some salt to her cooking.  I reviewed a low sodium diet and high sodium foods to avoid.  I also encouraged her to avoid table salt.  She denies any issues with getting or taking medications.  Her husband is currently hospitalized as well--her granddaughter provides support as needed.  She and her husband live in [place] and she plans to return there upon discharge. She follows with [facility] as an outpatient.      Education Materials:  "Living Better With Heart Failure" Booklet, Daily Weight  Tracker Tool   High Risk Criteria for Readmission and/or Poor Patient Outcomes:   EF <30%- yes 15-20%   2 or more admissions in 6 months- No  Difficult social situation- No--husband is in the hospital currently  Demonstrates medication noncompliance- No denies    Barriers of Care:  Knowledge and compliance  Discharge Planning:   Plans to return to home with husband in [place].   She could benefit from [facility] for ongoing education, compliance reinforcement and symptom recognition.

## 2015-06-10 NOTE — Progress Notes (Signed)
Utilization review completed. Ezella Kell, RN, BSN. 

## 2015-06-11 ENCOUNTER — Inpatient Hospital Stay (HOSPITAL_COMMUNITY): Payer: Medicare Other

## 2015-06-11 ENCOUNTER — Encounter (HOSPITAL_COMMUNITY): Payer: Self-pay | Admitting: Cardiovascular Disease

## 2015-06-11 DIAGNOSIS — N182 Chronic kidney disease, stage 2 (mild): Secondary | ICD-10-CM

## 2015-06-11 DIAGNOSIS — I119 Hypertensive heart disease without heart failure: Secondary | ICD-10-CM

## 2015-06-11 DIAGNOSIS — I110 Hypertensive heart disease with heart failure: Secondary | ICD-10-CM

## 2015-06-11 LAB — BASIC METABOLIC PANEL
ANION GAP: 13 (ref 5–15)
BUN: 43 mg/dL — ABNORMAL HIGH (ref 6–20)
CHLORIDE: 98 mmol/L — AB (ref 101–111)
CO2: 27 mmol/L (ref 22–32)
Calcium: 8.9 mg/dL (ref 8.9–10.3)
Creatinine, Ser: 1.8 mg/dL — ABNORMAL HIGH (ref 0.44–1.00)
GFR calc Af Amer: 30 mL/min — ABNORMAL LOW (ref >60)
GFR, EST NON AFRICAN AMERICAN: 26 mL/min — AB (ref >60)
GLUCOSE: 105 mg/dL — AB (ref 65–99)
POTASSIUM: 4.2 mmol/L (ref 3.5–5.1)
Sodium: 138 mmol/L (ref 135–145)

## 2015-06-11 LAB — URINALYSIS, ROUTINE W REFLEX MICROSCOPIC
Bilirubin Urine: NEGATIVE
GLUCOSE, UA: NEGATIVE mg/dL
KETONES UR: NEGATIVE mg/dL
Nitrite: POSITIVE — AB
PROTEIN: NEGATIVE mg/dL
Specific Gravity, Urine: 1.01 (ref 1.005–1.030)
pH: 5.5 (ref 5.0–8.0)

## 2015-06-11 LAB — URINE MICROSCOPIC-ADD ON

## 2015-06-11 MED ORDER — DEXTROSE 5 % IV SOLN
2.0000 g | Freq: Every day | INTRAVENOUS | Status: DC
  Administered 2015-06-11: 2 g via INTRAVENOUS
  Filled 2015-06-11 (×2): qty 2

## 2015-06-11 MED ORDER — METOLAZONE 2.5 MG PO TABS
2.5000 mg | ORAL_TABLET | Freq: Two times a day (BID) | ORAL | Status: DC
  Administered 2015-06-11 – 2015-06-15 (×9): 2.5 mg via ORAL
  Filled 2015-06-11 (×9): qty 1

## 2015-06-11 MED ORDER — SODIUM CHLORIDE 0.9% FLUSH
10.0000 mL | INTRAVENOUS | Status: DC | PRN
  Administered 2015-06-17 – 2015-06-18 (×2): 20 mL
  Filled 2015-06-11 (×2): qty 40

## 2015-06-11 NOTE — Progress Notes (Signed)
Pt's urinalysis resulted positive bacteria, Dr. Virgina Organ notified and ordered rocephin IV.

## 2015-06-11 NOTE — Care Management Note (Signed)
Case Management Note  Patient Details  Name: Michele Caldwell MRN: [id number] Date of Birth: Sep 03, 1934  Subjective/Objective:   81 y.o. F who lives with spouse in private residence in [place]. Spouse also a pt in this hospital currently. Children, grandchildren are able to assist as this couple will allow. Spouse tends to be in control of rental properties, family affairs and the health of himself and this pt.                  Action/Plan:Will await evaluation results of PT/OT. CM will continue to follow for discharge needs.   Expected Discharge Date:                  Expected Discharge Plan:  Home/Self Care  In-House Referral:     Discharge planning Services  CM Consult  Post Acute Care Choice:    Choice offered to:     DME Arranged:    DME Agency:     HH Arranged:    HH Agency:     Status of Service:  In process, will continue to follow  Medicare Important Message Given:    Date Medicare IM Given:    Medicare IM give by:    Date Additional Medicare IM Given:    Additional Medicare Important Message give by:     If discussed at Long Length of Stay Meetings, dates discussed:    Additional Comments:  Yvone Neu, RN 06/11/2015, 9:55 AM

## 2015-06-11 NOTE — Progress Notes (Signed)
Patient Profile: Michele Caldwell is a 81 y.o. female with a hx of HFrEF secondary to NICM, LBBB, s/p AICD, CKD. LHC in 2008 demonstrated no CAD. Echo in 2011 with EF 15%. Admitted from the office 06/09/15 for acute on chronic CHF. Massively volume overloaded and symptomatic in the office. Repeat echo performed in office prior to admission. EF unchanged at 15-20%.    Subjective: Feeling better. No complaints this morning. Out of bed, sitting in chair. Denies any dyspnea.     Objective: Vital signs in last 24 hours: Temp:  [97.3 F (36.3 C)-97.6 F (36.4 C)] 97.4 F (36.3 C) (02/15 0642) Pulse Rate:  [74-86] 74 (02/15 0642) Resp:  [16-18] 16 (02/15 0642) BP: (92-99)/(57-72) 98/68 mmHg (02/15 0642) SpO2:  [94 %-98 %] 97 % (02/15 0642) Weight:  [198 lb 5.9 oz (89.98 kg)] 198 lb 5.9 oz (89.98 kg) (02/15 0642) Last BM Date: 06/10/15  Intake/Output from previous day: 02/14 0701 - 02/15 0700 In: 938 [P.O.:864; I.V.:24; IV Piggyback:50] Out: 1125 [Urine:1125] Intake/Output this shift:    Medications Current Facility-Administered Medications  Medication Dose Route Frequency Provider Last Rate Last Dose  . 0.9 %  sodium chloride infusion  250 mL Intravenous PRN Beatrice Lecher, PA-C      . acetaminophen (TYLENOL) tablet 650 mg  650 mg Oral Q4H PRN Beatrice Lecher, PA-C      . aspirin chewable tablet 81 mg  81 mg Oral Daily Beatrice Lecher, PA-C   81 mg at 06/11/15 0817  . carvedilol (COREG) tablet 6.25 mg  6.25 mg Oral BID WC Beatrice Lecher, PA-C   6.25 mg at 06/11/15 0817  . cefTRIAXone (ROCEPHIN) 2 g in dextrose 5 % 50 mL IVPB  2 g Intravenous Daily Joellyn Rued, MD   2 g at 06/11/15 0530  . enoxaparin (LOVENOX) injection 30 mg  30 mg Subcutaneous Q24H Gwenlyn Found Carney, RPH   30 mg at 06/10/15 2057  . furosemide (LASIX) 250 mg in dextrose 5 % 250 mL (1 mg/mL) infusion  8 mg/hr Intravenous Continuous Chilton Si, MD 8 mL/hr at 06/10/15 1600 8 mg/hr at 06/10/15 1600  . ondansetron  (ZOFRAN) injection 4 mg  4 mg Intravenous Q6H PRN Beatrice Lecher, PA-C      . sodium chloride flush (NS) 0.9 % injection 3 mL  3 mL Intravenous Q12H Beatrice Lecher, PA-C   3 mL at 06/11/15 1000  . sodium chloride flush (NS) 0.9 % injection 3 mL  3 mL Intravenous PRN Beatrice Lecher, PA-C       Filed Weights   06/09/15 1824 06/10/15 0325 06/11/15 0642  Weight: 197 lb 12.8 oz (89.721 kg) 198 lb 11.2 oz (90.13 kg) 198 lb 5.9 oz (89.98 kg)    PE: General appearance: alert, cooperative and no distress Neck: no carotid bruit and elevated JVD Lungs: left sided basilar rales Heart: regular rate and rhythm and 2/6 murmur loudest at apex Extremities: 2+ bilateral LEE up to thighs Pulses: 2+ and symmetric Skin: warm and dry Neurologic: Grossly normal  Lab Results:   Recent Labs  06/09/15 1919  WBC 4.7  HGB 11.6*  HCT 36.1  PLT 162   BMET  Recent Labs  06/09/15 1919 06/10/15 0517 06/11/15 0551  NA 139 138 138  K 4.2 5.3* 4.2  CL 100* 99* 98*  CO2 26 25 27   GLUCOSE 188* 140* 105*  BUN 43* 44* 43*  CREATININE 1.89* 1.75* 1.80*  CALCIUM  8.8* 8.7* 8.9   PT/INR  Recent Labs  06/09/15 1919  LABPROT 16.9*  INR 1.36   Cardiac Panel (last 3 results) No results for input(s): CKTOTAL, CKMB, TROPONINI, RELINDX in the last 72 hours.  Studies/Results: 2D echo 06/09/15  Study Conclusions  - Left ventricle: LVEF is severely depressed at approximately 15 to 20% with diffuse hypokinesis; inferior, inferoseptal and posterior akinesis. The cavity size was severely dilated. Wall thickness was increased in a pattern of mild LVH. - Mitral valve: There was severe regurgitation. - Left atrium: The atrium was severely dilated. - Right ventricle: Systolic function was mildly reduced. - Right atrium: The atrium was mildly dilated. - Tricuspid valve: There was moderate regurgitation. - Pulmonary arteries: PA peak pressure: 55 mm Hg (S).  Assessment/Plan  Principal Problem:    Acute on chronic systolic CHF (congestive heart failure) (HCC) Active Problems:   Benign hypertensive heart disease with heart failure (HCC)   NICM (nonischemic cardiomyopathy) (HCC)   RENAL DISEASE, CHRONIC, STAGE II   Automatic implantable cardioverter-defibrillator in situ   Mitral regurgitation    1. Acute on Chronic Systolic CHF: Now with foley for output measurement. -1.1 L out yesterday. Weight is unchanged at 198 lb. She continues to have significant bilateral LEE and needs further diuresis. BP is soft but stable. Slight bump in SCr 1.89>>1.75>>1.80. Continue lasix drip. May need to titrate her rate upward for better diuresis. Continue low sodium diet and daily weights.   2. NICM: chronically low LVF. Recent echo shows no significant change in LV systolic function with EF still at 15-20%. Cath in 2008 showed no CAD. She has an ICD and has been on medical therapy with BB and spironolactone as an OP.   3. HTN: BP is soft but stable. Her home Coreg was decreased in the office down to 6.25 mg BID and spironolactone is on hold. She is also on lasix drip for diuresis.   4. CKD: SCr back up slightly from 1.89>>1.75>>1.80. Her BP is soft in the upper 90s systolic. Given soft BP, baseline renal function and need to diurese, will continue to hold spironolactone.    5. Mitral Regurgitation: 2D echo 06/09/15 demonstrated severe MR. Peak gradient was 8 mm Hg. The LA is severely dilated. Patient is not a suitable candidate for mitral valve repair.   6. AICD: implanted for low EF, <35%, in the setting of NICM. Followed by Dr. Graciela Husbands. She also has LBBB. Consider BiV upgrade, given EF <35% in the setting of LBBB.    7. Hyperkalemia: Improved. K yesterday was 5.3. Spironolactone is on hold given hyperkalemia, renal function and soft BP. K is WNL today at 4.2. Supplemental K is also on hold. Will need to monitor BMP daily given she is on lasix drip. Resume supplemental K if she develops hypokalemia.     LOS: 2 days    Jalynn Waddell M. Sharol Harness, PA-C 06/11/2015 9:04 AM

## 2015-06-11 NOTE — Progress Notes (Signed)
I spoke with Ms. Endicott's daughter and the patient now agrees to PICC placement and transfer to SDU. Will arrange for transfer. The HF team will assume care.   Khiem Gargis,MD 3:13 PM

## 2015-06-11 NOTE — Progress Notes (Signed)
  The AHF team saw Michele Caldwell and we had a long-talk with her and her family. She currently has low output HF and cardiorenal syndrome with marked volume overload. She is not responding to IV lasix. We explained that this is a poor prognosis and we offered her placement of a PICC line to measure MV sat and CVP to help guide therapy. We explained that she may require at least short-term inotropic support.  At this point she is refusing PICC line and would like to think about it more. Would increase lasix gtt to 15/hr. Add metolazone 2.5 bid and stop carvedilol. Can add hydralazine as BP tolerates.   We will f/u.   Bensimhon, Daniel,MD 2:17 PM

## 2015-06-11 NOTE — Progress Notes (Signed)
Peripherally Inserted Central Catheter/Midline Placement  The IV Nurse has discussed with the patient and/or persons authorized to consent for the patient, the purpose of this procedure and the potential benefits and risks involved with this procedure.  The benefits include less needle sticks, lab draws from the catheter and patient may be discharged home with the catheter.  Risks include, but not limited to, infection, bleeding, blood clot (thrombus formation), and puncture of an artery; nerve damage and irregular heat beat.  Alternatives to this procedure were also discussed.  PICC/Midline Placement Documentation  PICC Double Lumen 06/11/15 PICC Right Basilic 42 cm 0 cm (Active)  Indication for Insertion or Continuance of Line Vasoactive infusions 06/11/2015  6:25 PM  Exposed Catheter (cm) 0 cm 06/11/2015  6:25 PM  Dressing Change Due 06/18/15 06/11/2015  6:25 PM       Stacie Glaze Horton 06/11/2015, 6:41 PM

## 2015-06-11 NOTE — Progress Notes (Signed)
Foley inserted per MD for strict I/O. Pt. Having frequency with little output. Urine cloudy with odor. Order for Urinalysis and culture obtained from Virgina Organ, MD.

## 2015-06-12 ENCOUNTER — Inpatient Hospital Stay (HOSPITAL_COMMUNITY): Payer: Medicare Other

## 2015-06-12 LAB — CARBOXYHEMOGLOBIN
Carboxyhemoglobin: 1.3 % (ref 0.5–1.5)
Methemoglobin: 0.8 % (ref 0.0–1.5)
O2 SAT: 40.6 %
Total hemoglobin: 12.4 g/dL (ref 12.0–16.0)

## 2015-06-12 LAB — BASIC METABOLIC PANEL
ANION GAP: 10 (ref 5–15)
BUN: 42 mg/dL — ABNORMAL HIGH (ref 6–20)
CALCIUM: 9 mg/dL (ref 8.9–10.3)
CO2: 30 mmol/L (ref 22–32)
Chloride: 99 mmol/L — ABNORMAL LOW (ref 101–111)
Creatinine, Ser: 1.88 mg/dL — ABNORMAL HIGH (ref 0.44–1.00)
GFR calc Af Amer: 28 mL/min — ABNORMAL LOW (ref >60)
GFR calc non Af Amer: 24 mL/min — ABNORMAL LOW (ref >60)
GLUCOSE: 118 mg/dL — AB (ref 65–99)
Potassium: 4.2 mmol/L (ref 3.5–5.1)
Sodium: 139 mmol/L (ref 135–145)

## 2015-06-12 LAB — MRSA PCR SCREENING: MRSA BY PCR: NEGATIVE

## 2015-06-12 MED ORDER — MILRINONE IN DEXTROSE 20 MG/100ML IV SOLN
0.1250 ug/kg/min | INTRAVENOUS | Status: DC
  Administered 2015-06-12 – 2015-06-14 (×4): 0.25 ug/kg/min via INTRAVENOUS
  Administered 2015-06-15: 0.125 ug/kg/min via INTRAVENOUS
  Administered 2015-06-15: 0.25 ug/kg/min via INTRAVENOUS
  Filled 2015-06-12 (×6): qty 100

## 2015-06-12 MED ORDER — CHLORHEXIDINE GLUCONATE 4 % EX LIQD
CUTANEOUS | Status: AC
  Filled 2015-06-12: qty 15

## 2015-06-12 MED ORDER — LIDOCAINE HCL 1 % IJ SOLN
INTRAMUSCULAR | Status: AC
  Filled 2015-06-12: qty 20

## 2015-06-12 MED ORDER — CETYLPYRIDINIUM CHLORIDE 0.05 % MT LIQD
7.0000 mL | Freq: Two times a day (BID) | OROMUCOSAL | Status: DC
  Administered 2015-06-12 – 2015-06-18 (×12): 7 mL via OROMUCOSAL

## 2015-06-12 MED ORDER — DEXTROSE 5 % IV SOLN
1.0000 g | Freq: Every day | INTRAVENOUS | Status: DC
  Administered 2015-06-12 – 2015-06-13 (×2): 1 g via INTRAVENOUS
  Filled 2015-06-12 (×3): qty 10

## 2015-06-12 NOTE — Progress Notes (Signed)
Occupational Therapy Evaluation Patient Details Name: Michele Caldwell MRN: [id number] DOB: [birth year] Today's Date: 06/12/2015    History of Present Illness Michele Caldwell is a 81 y.o. female with a hx of HFrEF secondary to NICM, LBBB, s/p AICD, CKD. LHC in 2008 demonstrated no CAD. Echo in 2011 with EF 15%.Admitted from the office 06/09/15 for acute on chronic CHF. Massively volume overloaded and symptomatic in the office.   Clinical Impression   PTA, pt independent with ADL and mobility. Pt demonstrates decline in functional status and will benefit from acute OT to maximize functional level of independence with ADL and mobility to facilitate a safe D/C home with initial 24/7 S. Grand daughter discussed her concern over her grandmother's withdrawal from activities she use to enjoy. Very flat affect during session. Question if pt is demonstrating signs of depression. Will follow to address established goals.     Follow Up Recommendations  No OT follow up;Supervision/Assistance - 24 hour (initially)    Equipment Recommendations  Other (comment) (rollator) - will assess; shower chair   Recommendations for Other Services       Precautions / Restrictions Precautions Precautions: Fall Restrictions Weight Bearing Restrictions: No      Mobility Bed Mobility               General bed mobility comments: OOB in chair  Transfers Overall transfer level: Needs assistance Equipment used: 1 person hand held assist Transfers: Sit to/from Stand Sit to Stand: Min guard         General transfer comment: HHA for mobility    Balance     Sitting balance-Leahy Scale: Good       Standing balance-Leahy Scale: Fair                              ADL Overall ADL's : Needs assistance/impaired     Grooming: Set up   Upper Body Bathing: Set up   Lower Body Bathing: Supervison/ safety;Set up   Upper Body Dressing : Set up;Sitting   Lower Body Dressing:  Supervision/safety;Set up   Toilet Transfer: Minimal assistance Toilet Transfer Details (indicate cue type and reason): HHA Toileting- Clothing Manipulation and Hygiene: Min guard (simulated)       Functional mobility during ADLs: Minimal assistance General ADL Comments: Grand daughter present and states she is concerned over her grandmother's decline in function and "withdrawel" from activities she enjoys. States "she is just not herself".   Also staes she fatigues when out in community. Rollator may assist with activity tolerance - will assess     Vision     Perception     Praxis      Pertinent Vitals/Pain Pain Assessment: No/denies pain     Hand Dominance     Extremity/Trunk Assessment Upper Extremity Assessment Upper Extremity Assessment: Overall WFL for tasks assessed   Lower Extremity Assessment Lower Extremity Assessment: Overall WFL for tasks assessed   Cervical / Trunk Assessment Cervical / Trunk Assessment: Kyphotic   Communication Communication Communication: No difficulties   Cognition Arousal/Alertness: Awake/alert Behavior During Therapy: Flat affect Overall Cognitive Status: Within Functional Limits for tasks assessed                     General Comments       Exercises       Shoulder Instructions      Home Living Family/patient expects to be discharged to:: Private residence Living Arrangements: Spouse/significant  other Available Help at Discharge: Family;Available 24 hours/day Type of Home: House Home Access: Stairs to enter [employer] of Steps: 2 Entrance Stairs-Rails: Can reach both Home Layout: One level     Bathroom Shower/Tub: Chief Strategy Officer: Standard Bathroom Accessibility: Yes How Accessible: Accessible via walker Home Equipment: None          Prior Functioning/Environment Level of Independence: Independent        Comments: Daughter/granddaughter will occasionally help with  grocery shopping (Grand daughter reports a decline in activity in the last 51mo)    OT Diagnosis: Generalized weakness   OT Problem List: Decreased activity tolerance;Decreased knowledge of use of DME or AE;Cardiopulmonary status limiting activity   OT Treatment/Interventions: Self-care/ADL training;DME and/or AE instruction;Energy conservation;Therapeutic activities;Patient/family education    OT Goals(Current goals can be found in the care plan section) Acute Rehab OT Goals Patient Stated Goal: none stated OT Goal Formulation: With patient Time For Goal Achievement: 06/26/15 Potential to Achieve Goals: Good  OT Frequency: Min 2X/week   Barriers to D/C:            Co-evaluation              End of Session Equipment Utilized During Treatment: Gait belt Nurse Communication: Mobility status  Activity Tolerance: Patient tolerated treatment well Patient left: in chair;with call bell/phone within reach;with family/visitor present   Time: 1610-9604 OT Time Calculation (min): 20 min Charges:  OT General Charges $OT Visit: 1 Procedure OT Evaluation $OT Eval Moderate Complexity: 1 Procedure G-Codes:    Michele Caldwell,Michele Caldwell 06-23-15, 5:22 PM   [facility], OTR/L  [phone] 2015-06-23

## 2015-06-12 NOTE — Procedures (Signed)
PICC was malpositioned in right jugular vein.  PICC exchanged for new dual lumen PICC, tip at SVC/RA junction and ready to use.  No immediate complication.  Length = 40 cm.

## 2015-06-12 NOTE — Progress Notes (Signed)
1700 repeat carboxyhemoglobin done by Dr. Gala Romney and sen to lab . MIrinonedrip started via pump as ordered int picc line to R brachial site . Lasix drip on progress  Report given to [place] 2 H RN transferred to /2h Family member in attendance . Pt in no acute distress

## 2015-06-12 NOTE — Care Management Important Message (Signed)
Important Message  Patient Details  Name: Michele Caldwell MRN: [id number] Date of Birth: 04-02-1935   Medicare Important Message Given:  Yes    Cherese Lozano P Antanette Richwine 06/12/2015, 12:06 PM

## 2015-06-12 NOTE — Progress Notes (Signed)
Informed by IV team that pt's PICC needs to be replaced through IR in the morning, Dr. Onalee Hua informed and placed order for PICC placement through IR in the morning, and also deferred transfer to SDU until after PICC placement.

## 2015-06-12 NOTE — Evaluation (Signed)
Physical Therapy Evaluation Patient Details Name: Michele Caldwell MRN: [id number] DOB: 1934-05-02 Today's Date: 06/12/2015   History of Present Illness  Michele Caldwell is a 81 y.o. female with a hx of HFrEF secondary to NICM, LBBB, s/p AICD, CKD. LHC in 2008 demonstrated no CAD. Echo in 2011 with EF 15%. She was seen by Dr. Sherryl Manges in 8/16.She is here with her granddaughter who is a Charity fundraiser at [employer]. She is concerned that the patient should be admitted for CHF. The patient seems to downplay her symptoms. She denies dyspnea. However, her granddaughter notes that she has been more SOB. She notes + orthopnea  Clinical Impression  Pt presenting today with the PT deficits listed below limiting activity tolerance and ambulation today. PTA pt was independent w/o AD around the home. Pt displayed proper use of RW and no safety concerns however plans to return home w/o AD and with use her husbands if needed. Pt requires Min A ambulating w/o AD and would benefit from continued skilled therapy for balance training. Recommending D/C with HHPT to maximize recovery towards Independence w/o AD need.    Follow Up Recommendations Home health PT;Supervision - Intermittent    Equipment Recommendations  None recommended by PT    Recommendations for Other Services       Precautions / Restrictions Precautions Precautions: Fall Restrictions Weight Bearing Restrictions: No      Mobility  Bed Mobility Overal bed mobility: Needs Assistance Bed Mobility: Sit to Supine       Sit to supine: Supervision   General bed mobility comments: returned to bed for transport, needing VCs to position in bed to bring hips to middle  Transfers Overall transfer level: Needs assistance Equipment used: Rolling walker (2 wheeled);None Transfers: Sit to/from Stand Sit to Stand: Min guard         General transfer comment: Close Guard for balance, stable with Rw, needing HHA w/o AD  Ambulation/Gait Ambulation/Gait  assistance: Min assist;Min guard Ambulation Distance (Feet): 100 Feet Assistive device: Rolling walker (2 wheeled);None (IV pole) Gait Pattern/deviations: Step-through pattern;Decreased stride length;Drifts right/left;Trunk flexed Gait velocity: decreased Gait velocity interpretation: <1.8 ft/sec, indicative of risk for recurrent falls General Gait Details: decreased foot clearance, stable with RW, w/o AD pt requiring HHA with minimal exerternal support and cueing to continue forward progression and turns  Information systems manager [place] (Stroke Patients Only)       Balance Overall balance assessment: Needs assistance Sitting-balance support: Feet supported;No upper extremity supported Sitting balance-Leahy Scale: Good     Standing balance support: No upper extremity supported;During functional activity Standing balance-Leahy Scale: Fair                               Pertinent Vitals/Pain Pain Assessment: No/denies pain    Home Living Family/patient expects to be discharged to:: Private residence Living Arrangements: Spouse/significant other Available Help at Discharge: Family;Available 24 hours/day Type of Home: House Home Access: Stairs to enter Entrance Stairs-Rails: Can reach both Entrance Stairs-Number of Steps: 2 Home Layout: One level Home Equipment: None      Prior Function Level of Independence: Independent         Comments: Daughter/granddaughter will occasionally help with grocery shopping     Hand Dominance        Extremity/Trunk Assessment   Upper Extremity Assessment: Defer to OT evaluation  Lower Extremity Assessment: Overall WFL for tasks assessed      Cervical / Trunk Assessment: Kyphotic  Communication   Communication: No difficulties  Cognition Arousal/Alertness: Awake/alert Behavior During Therapy: Flat affect Overall Cognitive Status: Within Functional Limits for tasks  assessed                      General Comments      Exercises        Assessment/Plan    PT Assessment Patient needs continued PT services  PT Diagnosis Generalized weakness   PT Problem List Decreased activity tolerance;Decreased balance;Decreased mobility;Decreased strength;Decreased knowledge of use of DME  PT Treatment Interventions Gait training;Stair training;Functional mobility training;Therapeutic activities;Therapeutic exercise;Balance training   PT Goals (Current goals can be found in the Care Plan section) Acute Rehab PT Goals Patient Stated Goal: go home PT Goal Formulation: With patient Time For Goal Achievement: 06/25/15 Potential to Achieve Goals: Good    Frequency Min 3X/week   Barriers to discharge        Co-evaluation               End of Session Equipment Utilized During Treatment: Gait belt Activity Tolerance: Patient tolerated treatment well Patient left: in bed;with call bell/phone within reach;with nursing/sitter in room Nurse Communication: Mobility status         Time: 1610-9604 PT Time Calculation (min) (ACUTE ONLY): 25 min   Charges:   PT Evaluation $PT Eval Low Complexity: 1 Procedure PT Treatments $Gait Training: 8-22 mins   PT G Codes:        Ulyses Jarred 07/10/2015, 1:42 PM  Ulyses Jarred, Student Physical Therapist Acute Rehab [phone]

## 2015-06-12 NOTE — Progress Notes (Addendum)
Advanced Heart Failure Rounding Note  PCP: None Primary Cardiologist: Dr Graciela Husbands  Subjective:    Michele Caldwell is a 81 y.o. female with a hx of HFrEF secondary to NICM, LBBB, s/p AICD, CKD. LHC in 2008 demonstrated no CAD. Echo in 2011 with EF 15%. Admitted from the office 06/09/15 for acute on chronic CHF. Massively volume overloaded and symptomatic in the office. Repeat echo performed in office prior to admission. EF unchanged at 15-20%.   Denies acute SOB or DOE currently. No CP.  Still very swollen. PICC placed last night but needs repositioning by IR. Pt upset by this.   Creatinine 1.80 -> 1.88 up slightly. Electrolytes stable.   Objective:   Weight Range: 194 lb 3.2 oz (88.089 kg) Body mass index is 31.36 kg/(m^2).   Vital Signs:   Temp:  [97.6 F (36.4 C)-97.8 F (36.6 C)] 97.8 F (36.6 C) (02/16 0358) Pulse Rate:  [73-77] 75 (02/16 0358) Resp:  [20] 20 (02/16 0358) BP: (99-103)/(55-62) 103/62 mmHg (02/16 0358) SpO2:  [94 %-96 %] 94 % (02/16 0358) Weight:  [194 lb 3.2 oz (88.089 kg)] 194 lb 3.2 oz (88.089 kg) (02/16 0358) Last BM Date: 06/10/15  Weight change: Filed Weights   06/10/15 0325 06/11/15 0642 06/12/15 0358  Weight: 198 lb 11.2 oz (90.13 kg) 198 lb 5.9 oz (89.98 kg) 194 lb 3.2 oz (88.089 kg)    Intake/Output:   Intake/Output Summary (Last 24 hours) at 06/12/15 0903 Last data filed at 06/12/15 0651  Gross per 24 hour  Intake 1169.43 ml  Output   2600 ml  Net -1430.57 ml     Physical Exam: General:  Chronically ill and elderly appearing. No resp difficulty HEENT: normal Neck: supple. JVP elevated above jaw. Carotids 2+ bilat; no bruits. No thyromegaly or nodule noted. Cor: PMI nondisplaced. RRR. III/IV MR, +S3 Lungs: Mild basilar crackles. Abdomen: soft, NT, mildly distended, no HSM. No bruits or masses. +BS  Extremities: no cyanosis, clubbing, rash, Chronic, 2-3+ woody edema into thighs.  Neuro: alert & orientedx3, cranial nerves grossly  intact. moves all 4 extremities w/o difficulty. Affect pleasant   Telemetry: reviewed personally, NSR 70s  Labs: CBC  Recent Labs  06/09/15 1919  WBC 4.7  NEUTROABS 3.1  HGB 11.6*  HCT 36.1  MCV 86.0  PLT 162   Basic Metabolic Panel  Recent Labs  06/09/15 1919  06/11/15 0551 06/12/15 0434  NA 139  < > 138 139  K 4.2  < > 4.2 4.2  CL 100*  < > 98* 99*  CO2 26  < > 27 30  GLUCOSE 188*  < > 105* 118*  BUN 43*  < > 43* 42*  CREATININE 1.89*  < > 1.80* 1.88*  CALCIUM 8.8*  < > 8.9 9.0  MG 2.3  --   --   --   < > = values in this interval not displayed. Liver Function Tests  Recent Labs  06/09/15 1919  AST 24  ALT 18  ALKPHOS 62  BILITOT 1.7*  PROT 6.9  ALBUMIN 2.9*   No results for input(s): LIPASE, AMYLASE in the last 72 hours. Cardiac Enzymes No results for input(s): CKTOTAL, CKMB, CKMBINDEX, TROPONINI in the last 72 hours.  BNP: BNP (last 3 results)  Recent Labs  06/09/15 1920  BNP >4500.0*    ProBNP (last 3 results) No results for input(s): PROBNP in the last 8760 hours.   D-Dimer No results for input(s): DDIMER in the last 72 hours.  Hemoglobin A1C No results for input(s): HGBA1C in the last 72 hours. Fasting Lipid Panel No results for input(s): CHOL, HDL, LDLCALC, TRIG, CHOLHDL, LDLDIRECT in the last 72 hours. Thyroid Function Tests  Recent Labs  06/09/15 1920  TSH 1.256    Other results:     Imaging/Studies:  Dg Chest Port 1 View  06/11/2015  CLINICAL DATA:  81 year old female status post reposition of right PICC EXAM: PORTABLE CHEST 1 VIEW COMPARISON:  Prior chest x-ray earlier today 06/11/2015 at 19:19 p.m. FINDINGS: Unchanged position of right upper extremity approach PICC which deviates upward into the internal jugular vein. The tip of the catheter is not identified as it lies above the field of view. Otherwise, stable appearance of the chest with marked enlargement of the cardiopericardial silhouette, aortic atherosclerosis,  pulmonary edema and a left subclavian approach cardiac rhythm maintenance device. IMPRESSION: Unchanged position of the right upper extremity PICC which likely terminates in the right internal jugular vein. Electronically Signed   By: Malachy Moan M.D.   On: 06/11/2015 22:59   Dg Chest Port 1 View  06/11/2015  CLINICAL DATA:  81 year old female status post right PICC placement. EXAM: PORTABLE CHEST 1 VIEW COMPARISON:  Plain radiograph dated 06/10/2015 FINDINGS: Right-sided PICC extends into the right internal jugular vein. Recommend retraction and repositioning. The tip of the central line is beyond the superior image margin. Single portable view of the chest does not demonstrate focal consolidation. There is no pleural effusion or pneumothorax. Stable severe cardiomegaly. Left pectoral AICD device. IMPRESSION: Right-sided PICC with tip extending into the right IJ. Recommend retraction and repositioning. These results were called by telephone at the time of interpretation on 06/11/2015 at 7:34 pm to nurse Rasul, who verbally acknowledged these results. Electronically Signed   By: Elgie Collard M.D.   On: 06/11/2015 19:37     Latest Echo  Latest Cath   Medications:     Scheduled Medications: . aspirin  81 mg Oral Daily  . cefTRIAXone (ROCEPHIN)  IV  2 g Intravenous Daily  . enoxaparin (LOVENOX) injection  30 mg Subcutaneous Q24H  . metolazone  2.5 mg Oral BID  . sodium chloride flush  3 mL Intravenous Q12H     Infusions: . furosemide (LASIX) infusion 15 mg/hr (06/11/15 2014)     PRN Medications:  sodium chloride, acetaminophen, ondansetron (ZOFRAN) IV, sodium chloride flush, sodium chloride flush   Assessment   1. Acute on chronic systolic CHF 2/2 NICM 2. HTN 3. AKI on CKD III 4. Severe MR 5. AICD 6. Hyperkalemia  Plan    She remains markedly volume overloaded. PICC placed last night, but needs repositioning by IR this am.  Pt is again apprehensive about having this  done, but thinks she will proceed.  Modest diuresis on Lasix gtt @ 15. Continue with metolazone 2.5 mg BID. Awaiting PICC repositioning to further evaluate for low output.  Suspect she will need at least temporary course of milrinone.   Orders in to move to stepdown once PICC placed for CVP monitoring and Coox.   Length of Stay: 3   Graciella Freer PA-C 06/12/2015, 9:03 AM  Advanced Heart Failure Team Pager [phone] (M-F; 7a - 4p)  Please contact [facility] for night-coverage after hours (4p -7a ) and weekends on [email or web address]   Patient seen and examined with Otilio Saber, PA-C. We discussed all aspects of the encounter. I agree with the assessment and plan as stated above.   Urine output has picked up with  IV lasix and she seems to have more energy today. That said remains volume overloaded and creatinine continues to creep up. PICC line in place. I drew co-ox personally and discussed with 2H charge nurse. Hopefully can move to SDU for following co-ox and CVP. Suspect she may need short-term milrinone for inotropic support. I discussed with family at bedside.   Bensimhon, Daniel,MD 5:14 PM  Addendum: Co-ox 40%. Milrinone ordered. Will move to SDU.   Bensimhon, Daniel,MD 5:15 PM

## 2015-06-13 DIAGNOSIS — N179 Acute kidney failure, unspecified: Secondary | ICD-10-CM

## 2015-06-13 DIAGNOSIS — R570 Cardiogenic shock: Secondary | ICD-10-CM

## 2015-06-13 LAB — URINE CULTURE: Culture: >=100000

## 2015-06-13 LAB — CARBOXYHEMOGLOBIN
Carboxyhemoglobin: 1.6 % — ABNORMAL HIGH (ref 0.5–1.5)
Methemoglobin: 0.8 % (ref 0.0–1.5)
O2 Saturation: 65.7 %
Total hemoglobin: 11.6 g/dL — ABNORMAL LOW (ref 12.0–16.0)

## 2015-06-13 LAB — BASIC METABOLIC PANEL WITH GFR
Anion gap: 11 (ref 5–15)
BUN: 39 mg/dL — ABNORMAL HIGH (ref 6–20)
CO2: 36 mmol/L — ABNORMAL HIGH (ref 22–32)
Calcium: 9.1 mg/dL (ref 8.9–10.3)
Chloride: 92 mmol/L — ABNORMAL LOW (ref 101–111)
Creatinine, Ser: 1.68 mg/dL — ABNORMAL HIGH (ref 0.44–1.00)
GFR calc Af Amer: 33 mL/min — ABNORMAL LOW
GFR calc non Af Amer: 28 mL/min — ABNORMAL LOW
Glucose, Bld: 141 mg/dL — ABNORMAL HIGH (ref 65–99)
Potassium: 3.5 mmol/L (ref 3.5–5.1)
Sodium: 139 mmol/L (ref 135–145)

## 2015-06-13 LAB — MAGNESIUM: MAGNESIUM: 2.2 mg/dL (ref 1.7–2.4)

## 2015-06-13 MED ORDER — ISOSORB DINITRATE-HYDRALAZINE 20-37.5 MG PO TABS
0.5000 | ORAL_TABLET | Freq: Three times a day (TID) | ORAL | Status: DC
  Administered 2015-06-13 – 2015-06-15 (×9): 0.5 via ORAL
  Filled 2015-06-13 (×9): qty 1

## 2015-06-13 MED ORDER — POTASSIUM CHLORIDE CRYS ER 20 MEQ PO TBCR
40.0000 meq | EXTENDED_RELEASE_TABLET | Freq: Two times a day (BID) | ORAL | Status: DC
  Administered 2015-06-13 – 2015-06-15 (×6): 40 meq via ORAL
  Filled 2015-06-13 (×3): qty 2
  Filled 2015-06-13: qty 4
  Filled 2015-06-13 (×2): qty 2

## 2015-06-13 MED ORDER — SPIRONOLACTONE 25 MG PO TABS
12.5000 mg | ORAL_TABLET | Freq: Every day | ORAL | Status: DC
  Administered 2015-06-13 – 2015-06-15 (×3): 12.5 mg via ORAL
  Filled 2015-06-13 (×3): qty 1

## 2015-06-13 NOTE — Progress Notes (Signed)
Advanced Heart Failure Rounding Note  PCP: None Primary Cardiologist: Dr Graciela Husbands  Subjective:    Michele Caldwell is a 81 y.o. female with a hx of HFrEF secondary to NICM, LBBB, s/p AICD, CKD. LHC in 2008 demonstrated no CAD. Echo in 2011 with EF 15%. Admitted from the office 06/09/15 for acute on chronic CHF. Massively volume overloaded and symptomatic in the office. Repeat echo performed in office prior to admission. EF unchanged at 15-20%.   Now on milrinone 0.25. Initial Coox 40%.  Coox this morning 65.7. CVP 12-13  Out ~4.5 L x 24 hr.  Weight down 8 lbs. Feeling better this morning. Appetite improving. No SOB or CP currently. Denies lightheadedness or dizziness.   Creatinine 1.80 -> 1.88 ->1.68. K 3.5   Objective:   Weight Range: 186 lb 11.7 oz (84.7 kg) Body mass index is 30.15 kg/(m^2).   Vital Signs:   Temp:  [97.6 F (36.4 C)-98.2 F (36.8 C)] 98.2 F (36.8 C) (02/17 0400) Pulse Rate:  [81-93] 90 (02/17 0400) Resp:  [18-26] 19 (02/17 0400) BP: (95-113)/(53-87) 95/66 mmHg (02/17 0400) SpO2:  [94 %-100 %] 97 % (02/17 0400) Weight:  [186 lb 11.7 oz (84.7 kg)] 186 lb 11.7 oz (84.7 kg) (02/17 0400) Last BM Date: 06/10/15  Weight change: Filed Weights   06/11/15 0642 06/12/15 0358 06/13/15 0400  Weight: 198 lb 5.9 oz (89.98 kg) 194 lb 3.2 oz (88.089 kg) 186 lb 11.7 oz (84.7 kg)    Intake/Output:   Intake/Output Summary (Last 24 hours) at 06/13/15 0759 Last data filed at 06/13/15 0600  Gross per 24 hour  Intake 949.24 ml  Output   4225 ml  Net -3275.76 ml     Physical Exam: General:  Chronically ill and elderly appearing. NAD HEENT: normal Neck: supple. JVP to jaw. Carotids 2+ bilat; no bruits. No thyromegaly or nodule noted. Cor: PMI nondisplaced. RRR. III/IV MR, +S3 Lungs: Basilar crackles. Abdomen: soft, NT, mildly distended, no HSM. No bruits or masses. +BS  Extremities: no cyanosis, clubbing, rash, 2-3+ woody edema into thighs.  Neuro: alert &  orientedx3, cranial nerves grossly intact. moves all 4 extremities w/o difficulty. Affect pleasant   Telemetry: reviewed personally, NSR 80s  Labs: CBC No results for input(s): WBC, NEUTROABS, HGB, HCT, MCV, PLT in the last 72 hours. Basic Metabolic Panel  Recent Labs  06/12/15 0434 06/13/15 0420  NA 139 139  K 4.2 3.5  CL 99* 92*  CO2 30 36*  GLUCOSE 118* 141*  BUN 42* 39*  CREATININE 1.88* 1.68*  CALCIUM 9.0 9.1   Liver Function Tests No results for input(s): AST, ALT, ALKPHOS, BILITOT, PROT, ALBUMIN in the last 72 hours. No results for input(s): LIPASE, AMYLASE in the last 72 hours. Cardiac Enzymes No results for input(s): CKTOTAL, CKMB, CKMBINDEX, TROPONINI in the last 72 hours.  BNP: BNP (last 3 results)  Recent Labs  06/09/15 1920  BNP >4500.0*    ProBNP (last 3 results) No results for input(s): PROBNP in the last 8760 hours.   D-Dimer No results for input(s): DDIMER in the last 72 hours. Hemoglobin A1C No results for input(s): HGBA1C in the last 72 hours. Fasting Lipid Panel No results for input(s): CHOL, HDL, LDLCALC, TRIG, CHOLHDL, LDLDIRECT in the last 72 hours. Thyroid Function Tests No results for input(s): TSH, T4TOTAL, T3FREE, THYROIDAB in the last 72 hours.  Invalid input(s): FREET3  Other results:     Imaging/Studies:  Ir Fluoro Guide Cv Line Right  06/12/2015  INDICATION: Malpositioned PICC line. EXAM: PICC LINE EXCHANGE WITH FLUOROSCOPY Physician: Rachelle Hora. Henn, MD MEDICATIONS: None ANESTHESIA/SEDATION: None FLUOROSCOPY TIME:  Fluoroscopy Time: 30 seconds, 4 mGy COMPLICATIONS: None immediate. PROCEDURE: The procedure was explained to the patient. The risks and benefits of the procedure were discussed and the patient's questions were addressed. Informed consent was obtained from the patient. The existing right arm PICC line was prepped and draped in sterile fashion. Maximal barrier sterile technique was utilized including caps, mask,  sterile gowns, sterile gloves, sterile drape, hand hygiene and skin antiseptic. Fluoroscopy confirmed that the PICC line was extending into the right jugular vein. Catheter was pulled back and cut. Catheter was removed over a 0.018 wire. A peel-away sheath was placed. A dual lumen 5 French Power PICC was cut to 40 cm. Catheter was advanced through the peel-away sheath and positioned at superior cavoatrial junction. Both lumens aspirated and flushed well. Catheter was sutured to the skin with a StatLock device. Fluoroscopic images were taken and saved for this procedure. FINDINGS: Catheter tip at the superior cavoatrial junction. IMPRESSION: Successful exchange of a right arm PICC line with fluoroscopy. Electronically Signed   By: Richarda Overlie M.D.   On: 06/12/2015 15:26   Dg Chest Port 1 View  06/11/2015  CLINICAL DATA:  81 year old female status post reposition of right PICC EXAM: PORTABLE CHEST 1 VIEW COMPARISON:  Prior chest x-ray earlier today 06/11/2015 at 19:19 p.m. FINDINGS: Unchanged position of right upper extremity approach PICC which deviates upward into the internal jugular vein. The tip of the catheter is not identified as it lies above the field of view. Otherwise, stable appearance of the chest with marked enlargement of the cardiopericardial silhouette, aortic atherosclerosis, pulmonary edema and a left subclavian approach cardiac rhythm maintenance device. IMPRESSION: Unchanged position of the right upper extremity PICC which likely terminates in the right internal jugular vein. Electronically Signed   By: Malachy Moan M.D.   On: 06/11/2015 22:59   Dg Chest Port 1 View  06/11/2015  CLINICAL DATA:  81 year old female status post right PICC placement. EXAM: PORTABLE CHEST 1 VIEW COMPARISON:  Plain radiograph dated 06/10/2015 FINDINGS: Right-sided PICC extends into the right internal jugular vein. Recommend retraction and repositioning. The tip of the central line is beyond the superior image  margin. Single portable view of the chest does not demonstrate focal consolidation. There is no pleural effusion or pneumothorax. Stable severe cardiomegaly. Left pectoral AICD device. IMPRESSION: Right-sided PICC with tip extending into the right IJ. Recommend retraction and repositioning. These results were called by telephone at the time of interpretation on 06/11/2015 at 7:34 pm to nurse Rasul, who verbally acknowledged these results. Electronically Signed   By: Elgie Collard M.D.   On: 06/11/2015 19:37    Latest Echo  Latest Cath   Medications:     Scheduled Medications: . antiseptic oral rinse  7 mL Mouth Rinse BID  . aspirin  81 mg Oral Daily  . cefTRIAXone (ROCEPHIN)  IV  1 g Intravenous Daily  . enoxaparin (LOVENOX) injection  30 mg Subcutaneous Q24H  . metolazone  2.5 mg Oral BID  . sodium chloride flush  3 mL Intravenous Q12H    Infusions: . furosemide (LASIX) infusion 15 mg/hr (06/13/15 0450)  . milrinone 0.25 mcg/kg/min (06/13/15 0451)    PRN Medications: sodium chloride, acetaminophen, ondansetron (ZOFRAN) IV, sodium chloride flush, sodium chloride flush   Assessment   1. Acute on chronic systolic CHF 2/2 NICM 2. HTN 3. AKI on  CKD III 4. Severe MR 5. AICD 6. Hyperkalemia  Plan    She remains markedly volume overloaded. CVP 12-13  Now on mirlinone 0.25. Coox stable this morning at 65.7%. Initially 40%  BPs soft in 100s. Will not titrate meds yet.   Continue Lasix gtt @ 15 and metolazone 2.5 mg BID. Supp K.   Continue to follow BMETs closely with aggressive diuresis.   Length of Stay: 4   Graciella Freer PA-C 06/13/2015, 7:59 AM  Advanced Heart Failure Team Pager [phone] (M-F; 7a - 4p)  Please contact [facility] for night-coverage after hours (4p -7a ) and weekends on [email or web address]  Patient seen and examined with Otilio Saber, PA-C. We discussed all aspects of the encounter. I agree with the assessment and plan as stated above.    Much improved with milrinone. Co-ox better. Renal function improving. Volume improving. Will continue milrinone and IV lasix. Supp electrolytes as needed. Will start low-dose Bidil in an effort to help wean milrinone eventually. Start spiro 12.5   Manvir Prabhu,MD 9:15 AM

## 2015-06-13 NOTE — Progress Notes (Signed)
OT Cancellation Note  Patient Details Name: Laresha Thebeau MRN: [id number] DOB: May 05, 1934   Cancelled Treatment:    Reason Eval/Treat Not Completed: Patient not medically ready. Pt started on milrinone yesterday, BP is soft and CVP 12-13. Will attempt tx session at later date.  Evette Georges [phone] 06/13/2015, 1:36 PM

## 2015-06-14 DIAGNOSIS — M10061 Idiopathic gout, right knee: Secondary | ICD-10-CM

## 2015-06-14 LAB — BASIC METABOLIC PANEL
Anion gap: 9 (ref 5–15)
BUN: 40 mg/dL — AB (ref 6–20)
CALCIUM: 8.8 mg/dL — AB (ref 8.9–10.3)
CHLORIDE: 89 mmol/L — AB (ref 101–111)
CO2: 39 mmol/L — ABNORMAL HIGH (ref 22–32)
CREATININE: 1.66 mg/dL — AB (ref 0.44–1.00)
GFR calc Af Amer: 33 mL/min — ABNORMAL LOW (ref >60)
GFR calc non Af Amer: 28 mL/min — ABNORMAL LOW (ref >60)
Glucose, Bld: 140 mg/dL — ABNORMAL HIGH (ref 65–99)
Potassium: 4.2 mmol/L (ref 3.5–5.1)
SODIUM: 137 mmol/L (ref 135–145)

## 2015-06-14 LAB — CARBOXYHEMOGLOBIN
Carboxyhemoglobin: 1.6 % — ABNORMAL HIGH (ref 0.5–1.5)
Methemoglobin: 0.8 % (ref 0.0–1.5)
O2 Saturation: 73.6 %
TOTAL HEMOGLOBIN: 11.9 g/dL — AB (ref 12.0–16.0)

## 2015-06-14 LAB — URIC ACID: Uric Acid, Serum: 14.2 mg/dL — ABNORMAL HIGH (ref 2.3–6.6)

## 2015-06-14 MED ORDER — PREDNISONE 20 MG PO TABS
40.0000 mg | ORAL_TABLET | Freq: Every day | ORAL | Status: AC
  Administered 2015-06-14 – 2015-06-16 (×3): 40 mg via ORAL
  Filled 2015-06-14 (×3): qty 2

## 2015-06-14 NOTE — Progress Notes (Signed)
Bed weight off 20kg from last documented weight. Patient unable to stand this morning for weight. Weight not documented

## 2015-06-14 NOTE — Progress Notes (Signed)
Advanced Heart Failure Rounding Note  PCP: None Primary Cardiologist: Dr Graciela Husbands  Subjective:    Michele Caldwell is a 81 y.o. female with a hx of HFrEF secondary to NICM, LBBB, s/p AICD, CKD. LHC in 2008 demonstrated no CAD. Echo in 2011 with EF 15%. Admitted from the office 06/09/15 for acute on chronic CHF. Massively volume overloaded and symptomatic in the office. Repeat echo performed in office prior to admission. EF unchanged at 15-20%.   Now on milrinone 0.25. Initial Coox 40%.  Coox this morning 74%  CVP 10-11  Out another 5L . Weight not recorded today because unable to stand due to R knee pain. Sore to touch.  Appetite improving. No SOB or CP currently. Denies lightheadedness or dizziness.   Creatinine 1.80 -> 1.88 ->1.68.-> 1.66 K 4.2  Objective:   Weight Range: 84.7 kg (186 lb 11.7 oz) Body mass index is 30.15 kg/(m^2).   Vital Signs:   Temp:  [97 F (36.1 C)-99 F (37.2 C)] 97 F (36.1 C) (02/18 0822) Pulse Rate:  [85-109] 103 (02/18 0822) Resp:  [21-30] 27 (02/18 0822) BP: (76-108)/(42-64) 108/61 mmHg (02/18 0822) SpO2:  [89 %-100 %] 99 % (02/18 0822) Last BM Date: 06/10/15  Weight change: Filed Weights   06/11/15 0642 06/12/15 0358 06/13/15 0400  Weight: 89.98 kg (198 lb 5.9 oz) 88.089 kg (194 lb 3.2 oz) 84.7 kg (186 lb 11.7 oz)    Intake/Output:   Intake/Output Summary (Last 24 hours) at 06/14/15 0859 Last data filed at 06/14/15 0800  Gross per 24 hour  Intake 1378.4 ml  Output   5650 ml  Net -4271.6 ml     Physical Exam: General:  Chronically ill and elderly appearing. NAD HEENT: normal Neck: supple. JVP to jaw. Carotids 2+ bilat; no bruits. No thyromegaly or nodule noted. Cor: PMI nondisplaced. RRR. III/IV MR, +S3 Lungs: Basilar crackles. Abdomen: soft, NT, mildly distended, no HSM. No bruits or masses. +BS  Extremities: no cyanosis, clubbing, rash, 2+ woody edema into thighs. R knee sore to touch Neuro: alert & orientedx3, cranial nerves  grossly intact. moves all 4 extremities w/o difficulty. Affect pleasant   Telemetry: reviewed personally, NSR 80s  Labs: CBC No results for input(s): WBC, NEUTROABS, HGB, HCT, MCV, PLT in the last 72 hours. Basic Metabolic Panel  Recent Labs  06/13/15 0420 06/13/15 1130 06/14/15 0455  NA 139  --  137  K 3.5  --  4.2  CL 92*  --  89*  CO2 36*  --  39*  GLUCOSE 141*  --  140*  BUN 39*  --  40*  CREATININE 1.68*  --  1.66*  CALCIUM 9.1  --  8.8*  MG  --  2.2  --    Liver Function Tests No results for input(s): AST, ALT, ALKPHOS, BILITOT, PROT, ALBUMIN in the last 72 hours. No results for input(s): LIPASE, AMYLASE in the last 72 hours. Cardiac Enzymes No results for input(s): CKTOTAL, CKMB, CKMBINDEX, TROPONINI in the last 72 hours.  BNP: BNP (last 3 results)  Recent Labs  06/09/15 1920  BNP >4500.0*    ProBNP (last 3 results) No results for input(s): PROBNP in the last 8760 hours.   D-Dimer No results for input(s): DDIMER in the last 72 hours. Hemoglobin A1C No results for input(s): HGBA1C in the last 72 hours. Fasting Lipid Panel No results for input(s): CHOL, HDL, LDLCALC, TRIG, CHOLHDL, LDLDIRECT in the last 72 hours. Thyroid Function Tests No results for input(s):  TSH, T4TOTAL, T3FREE, THYROIDAB in the last 72 hours.  Invalid input(s): FREET3  Other results:     Imaging/Studies:  Ir Fluoro Guide Cv Line Right  06/12/2015  INDICATION: Malpositioned PICC line. EXAM: PICC LINE EXCHANGE WITH FLUOROSCOPY Physician: Rachelle Hora. Henn, MD MEDICATIONS: None ANESTHESIA/SEDATION: None FLUOROSCOPY TIME:  Fluoroscopy Time: 30 seconds, 4 mGy COMPLICATIONS: None immediate. PROCEDURE: The procedure was explained to the patient. The risks and benefits of the procedure were discussed and the patient's questions were addressed. Informed consent was obtained from the patient. The existing right arm PICC line was prepped and draped in sterile fashion. Maximal barrier sterile  technique was utilized including caps, mask, sterile gowns, sterile gloves, sterile drape, hand hygiene and skin antiseptic. Fluoroscopy confirmed that the PICC line was extending into the right jugular vein. Catheter was pulled back and cut. Catheter was removed over a 0.018 wire. A peel-away sheath was placed. A dual lumen 5 French Power PICC was cut to 40 cm. Catheter was advanced through the peel-away sheath and positioned at superior cavoatrial junction. Both lumens aspirated and flushed well. Catheter was sutured to the skin with a StatLock device. Fluoroscopic images were taken and saved for this procedure. FINDINGS: Catheter tip at the superior cavoatrial junction. IMPRESSION: Successful exchange of a right arm PICC line with fluoroscopy. Electronically Signed   By: Richarda Overlie M.D.   On: 06/12/2015 15:26    Latest Echo  Latest Cath   Medications:     Scheduled Medications: . antiseptic oral rinse  7 mL Mouth Rinse BID  . aspirin  81 mg Oral Daily  . cefTRIAXone (ROCEPHIN)  IV  1 g Intravenous Daily  . enoxaparin (LOVENOX) injection  30 mg Subcutaneous Q24H  . isosorbide-hydrALAZINE  0.5 tablet Oral TID  . metolazone  2.5 mg Oral BID  . potassium chloride  40 mEq Oral BID  . sodium chloride flush  3 mL Intravenous Q12H  . spironolactone  12.5 mg Oral Daily    Infusions: . furosemide (LASIX) infusion 15 mg/hr (06/14/15 0800)  . milrinone 0.25 mcg/kg/min (06/14/15 0800)    PRN Medications: sodium chloride, acetaminophen, ondansetron (ZOFRAN) IV, sodium chloride flush, sodium chloride flush   Assessment   1. Acute on chronic systolic CHF 2/2 NICM 2. HTN 3. AKI on CKD III 4. Severe MR 5. AICD 6. Hyperkalemia 7. R knee pain - likely acute gout.  8. Cellulitis - resolved.  9. LBBB -   Plan    Much improved with milrinone. Co-ox better. Renal function improving. Volume improving. Will continue milrinone and IV lasix.   Continue to titrate Bidil in an effort to  help wean milrinone eventually. May need home inotropes. She has been reluctant to consider CRT upgrade.  Has acute gout in R knee. Start prednisone  daily x 3days. Check uric acid. Initiate allopurinol when acute flare calmed down.   Continue to follow BMETs closely with aggressive diuresis.   Cellulitis resolved - Can stop ceftriaxone  Length of Stay: 5   Gracy Ehly MD  06/14/2015, 8:59 AM  Advanced Heart Failure Team Pager [phone] (M-F; 7a - 4p)  Please contact [facility] for night-coverage after hours (4p -7a ) and weekends on [email or web address]

## 2015-06-15 LAB — BASIC METABOLIC PANEL
ANION GAP: 12 (ref 5–15)
BUN: 44 mg/dL — AB (ref 6–20)
CHLORIDE: 83 mmol/L — AB (ref 101–111)
CO2: 38 mmol/L — ABNORMAL HIGH (ref 22–32)
Calcium: 8.7 mg/dL — ABNORMAL LOW (ref 8.9–10.3)
Creatinine, Ser: 1.63 mg/dL — ABNORMAL HIGH (ref 0.44–1.00)
GFR calc Af Amer: 34 mL/min — ABNORMAL LOW (ref >60)
GFR, EST NON AFRICAN AMERICAN: 29 mL/min — AB (ref >60)
Glucose, Bld: 177 mg/dL — ABNORMAL HIGH (ref 65–99)
POTASSIUM: 4.2 mmol/L (ref 3.5–5.1)
SODIUM: 133 mmol/L — AB (ref 135–145)

## 2015-06-15 LAB — CARBOXYHEMOGLOBIN
CARBOXYHEMOGLOBIN: 2.8 % — AB (ref 0.5–1.5)
METHEMOGLOBIN: 0.7 % (ref 0.0–1.5)
O2 SAT: 79.3 %
TOTAL HEMOGLOBIN: 11.1 g/dL — AB (ref 12.0–16.0)

## 2015-06-15 NOTE — Progress Notes (Signed)
Advanced Heart Failure Rounding Note  PCP: None Primary Cardiologist: Dr Graciela Husbands  Subjective:    Michele Caldwell is a 81 y.o. female with a hx of HFrEF secondary to NICM, LBBB, s/p AICD, CKD. LHC in 2008 demonstrated no CAD. Echo in 2011 with EF 15%. Admitted from the office 06/09/15 for acute on chronic CHF. Massively volume overloaded and symptomatic in the office. Repeat echo performed in office prior to admission. EF unchanged at 15-20%.   Now on milrinone 0.25. Initial Coox 40%.  Coox this morning 79%  CVP 9-10  Diuresing well. Weight down another 10 pounds (22 pounds total). Bidil added. SBP 90-100  Started on prednisone for acute R knee gout yesterday. Uric acid 14.2. Pain much improved today.   Creatinine 1.80 -> 1.88 ->1.68.-> 1.66 -> 1.63    Objective:   Weight Range: 80.1 kg (176 lb 9.4 oz) Body mass index is 28.52 kg/(m^2).   Vital Signs:   Temp:  [97.3 F (36.3 C)-98.5 F (36.9 C)] 97.3 F (36.3 C) (02/19 1158) Pulse Rate:  [94-108] 104 (02/19 1158) Resp:  [20-27] 23 (02/19 1158) BP: (87-96)/(43-57) 96/57 mmHg (02/19 1158) SpO2:  [96 %-98 %] 97 % (02/19 1158) Weight:  [80.1 kg (176 lb 9.4 oz)] 80.1 kg (176 lb 9.4 oz) (02/19 1100) Last BM Date: 06/10/15  Weight change: Filed Weights   06/12/15 0358 06/13/15 0400 06/15/15 1100  Weight: 88.089 kg (194 lb 3.2 oz) 84.7 kg (186 lb 11.7 oz) 80.1 kg (176 lb 9.4 oz)    Intake/Output:   Intake/Output Summary (Last 24 hours) at 06/15/15 1347 Last data filed at 06/15/15 1300  Gross per 24 hour  Intake    682 ml  Output   2625 ml  Net  -1943 ml     Physical Exam: General:  Sitting in chair. NAD HEENT: normal Neck: supple. JVP 10 Carotids 2+ bilat; no bruits. No thyromegaly or nodule noted. Cor: PMI nondisplaced. RRR. III/IV MR, +S3 Lungs: clear Abdomen: soft, NT, mildly distended, no HSM. No bruits or masses. +BS  Extremities: no cyanosis, clubbing, rash, 1-2+ woody edema Neuro: alert & orientedx3, cranial  nerves grossly intact. moves all 4 extremities w/o difficulty. Affect pleasant   Telemetry: reviewed personally, NSR 80s  Labs: CBC No results for input(s): WBC, NEUTROABS, HGB, HCT, MCV, PLT in the last 72 hours. Basic Metabolic Panel  Recent Labs  06/13/15 1130 06/14/15 0455 06/15/15 0429  NA  --  137 133*  K  --  4.2 4.2  CL  --  89* 83*  CO2  --  39* 38*  GLUCOSE  --  140* 177*  BUN  --  40* 44*  CREATININE  --  1.66* 1.63*  CALCIUM  --  8.8* 8.7*  MG 2.2  --   --    Liver Function Tests No results for input(s): AST, ALT, ALKPHOS, BILITOT, PROT, ALBUMIN in the last 72 hours. No results for input(s): LIPASE, AMYLASE in the last 72 hours. Cardiac Enzymes No results for input(s): CKTOTAL, CKMB, CKMBINDEX, TROPONINI in the last 72 hours.  BNP: BNP (last 3 results)  Recent Labs  06/09/15 1920  BNP >4500.0*    ProBNP (last 3 results) No results for input(s): PROBNP in the last 8760 hours.   D-Dimer No results for input(s): DDIMER in the last 72 hours. Hemoglobin A1C No results for input(s): HGBA1C in the last 72 hours. Fasting Lipid Panel No results for input(s): CHOL, HDL, LDLCALC, TRIG, CHOLHDL, LDLDIRECT in the  last 72 hours. Thyroid Function Tests No results for input(s): TSH, T4TOTAL, T3FREE, THYROIDAB in the last 72 hours.  Invalid input(s): FREET3  Other results:     Imaging/Studies:  No results found.  Latest Echo  Latest Cath   Medications:     Scheduled Medications: . antiseptic oral rinse  7 mL Mouth Rinse BID  . aspirin  81 mg Oral Daily  . enoxaparin (LOVENOX) injection  30 mg Subcutaneous Q24H  . isosorbide-hydrALAZINE  0.5 tablet Oral TID  . metolazone  2.5 mg Oral BID  . potassium chloride  40 mEq Oral BID  . predniSONE  40 mg Oral Q breakfast  . sodium chloride flush  3 mL Intravenous Q12H  . spironolactone  12.5 mg Oral Daily    Infusions: . furosemide (LASIX) infusion 15 mg/hr (06/15/15 0744)  . milrinone 0.25  mcg/kg/min (06/15/15 0328)    PRN Medications: sodium chloride, acetaminophen, ondansetron (ZOFRAN) IV, sodium chloride flush, sodium chloride flush   Assessment   1. Acute on chronic systolic CHF 2/2 NICM 2. HTN 3. AKI on CKD III 4. Severe MR 5. AICD 6. Hyperkalemia/hyponatremia 7. R knee pain - likely acute gout.  8. Cellulitis - resolved.  9. LBBB -   Plan    Much improved with milrinone. Co-ox better. Renal function improving. Volume improving but still with fluid on board. Will decrease milrinone to 0.125. Continue IV lasix.   BP too low to increase Bidil today. Perhaps tomorrow. Will continue to wean milrinone as tolerated. May need home inotropes. She has been reluctant to consider CRT upgrade. Bp too low for b-blocker or ACE.   Acute gout in R knee. Improved on prednisone 40mg . Today = day 2/3. Start allopurinol. .   Continue to follow BMETs closely with aggressive diuresis.   Cellulitis resolved - off ceftriaxone  Length of Stay: 6   Deniz Hannan MD  06/15/2015, 1:47 PM  Advanced Heart Failure Team Pager [phone] (M-F; 7a - 4p)  Please contact [facility] for night-coverage after hours (4p -7a ) and weekends on [email or web address]

## 2015-06-16 LAB — BASIC METABOLIC PANEL
ANION GAP: 13 (ref 5–15)
BUN: 52 mg/dL — AB (ref 6–20)
CHLORIDE: 78 mmol/L — AB (ref 101–111)
CO2: 40 mmol/L — ABNORMAL HIGH (ref 22–32)
Calcium: 9.1 mg/dL (ref 8.9–10.3)
Creatinine, Ser: 1.56 mg/dL — ABNORMAL HIGH (ref 0.44–1.00)
GFR calc Af Amer: 36 mL/min — ABNORMAL LOW (ref >60)
GFR, EST NON AFRICAN AMERICAN: 31 mL/min — AB (ref >60)
Glucose, Bld: 226 mg/dL — ABNORMAL HIGH (ref 65–99)
POTASSIUM: 5.2 mmol/L — AB (ref 3.5–5.1)
SODIUM: 131 mmol/L — AB (ref 135–145)

## 2015-06-16 LAB — CBC
HCT: 32.9 % — ABNORMAL LOW (ref 36.0–46.0)
HEMOGLOBIN: 11 g/dL — AB (ref 12.0–15.0)
MCH: 27.6 pg (ref 26.0–34.0)
MCHC: 33.4 g/dL (ref 30.0–36.0)
MCV: 82.5 fL (ref 78.0–100.0)
PLATELETS: 177 10*3/uL (ref 150–400)
RBC: 3.99 MIL/uL (ref 3.87–5.11)
RDW: 17.9 % — ABNORMAL HIGH (ref 11.5–15.5)
WBC: 11.8 10*3/uL — ABNORMAL HIGH (ref 4.0–10.5)

## 2015-06-16 LAB — CARBOXYHEMOGLOBIN
Carboxyhemoglobin: 1.4 % (ref 0.5–1.5)
METHEMOGLOBIN: 0.9 % (ref 0.0–1.5)
O2 Saturation: 77.7 %
TOTAL HEMOGLOBIN: 11.6 g/dL — AB (ref 12.0–16.0)

## 2015-06-16 MED ORDER — ISOSORB DINITRATE-HYDRALAZINE 20-37.5 MG PO TABS
1.0000 | ORAL_TABLET | Freq: Three times a day (TID) | ORAL | Status: DC
  Administered 2015-06-16 – 2015-06-18 (×8): 1 via ORAL
  Filled 2015-06-16 (×8): qty 1

## 2015-06-16 MED ORDER — MILRINONE IN DEXTROSE 20 MG/100ML IV SOLN: 0.1250 ug/kg/min | INTRAVENOUS | Status: AC

## 2015-06-16 MED ORDER — ALLOPURINOL 100 MG PO TABS
200.0000 mg | ORAL_TABLET | Freq: Every day | ORAL | Status: DC
Start: 2015-06-16 — End: 2015-06-18
  Administered 2015-06-16 – 2015-06-18 (×3): 200 mg via ORAL
  Filled 2015-06-16 (×3): qty 2

## 2015-06-16 MED ORDER — TORSEMIDE 20 MG PO TABS
40.0000 mg | ORAL_TABLET | Freq: Two times a day (BID) | ORAL | Status: DC
  Filled 2015-06-16: qty 2

## 2015-06-16 NOTE — Progress Notes (Signed)
CARDIAC REHAB PHASE I   PRE:  Rate/Rhythm: 99 SR  BP:  Sitting: 95/46        SaO2: 96 RA  MODE:  Ambulation: 160 ft   POST:  Rate/Rhythm: 114 ST  BP:  Sitting: 92/50         SaO2: 92 RA  Pt sitting up in recliner, agreeable to walk. BP remains low. Pt ambulated 160 ft on RA, IV, rolling walker, gait belt, foley, assist x1, very slow, steady gait, tolerated well. Pt denies any complaints other than some R knee discomfort related to gout, brief standing rest x3. Sats 91-92% on RA during ambulation. Reviewed CHF booklet and zone tool. Pt verbalized understanding, will continue to reinforce. Pt to recliner after walk, feet elevated, on room air (RN aware), call bell within reach. Will follow.   [id number] Joylene Grapes, RN, BSN 06/16/2015 11:28 AM

## 2015-06-16 NOTE — Care Management Important Message (Signed)
Important Message  Patient Details  Name: Genesia Strupp MRN: [id number] Date of Birth: 14-Dec-1934   Medicare Important Message Given:  Yes    Bernadette Hoit 06/16/2015, 10:52 AM

## 2015-06-16 NOTE — Progress Notes (Signed)
Advanced Heart Failure Rounding Note  PCP: None Primary Cardiologist: Dr Graciela Husbands  Subjective:    Michele Caldwell is a 81 y.o. female with a hx of HFrEF secondary to NICM, LBBB, s/p AICD, CKD. LHC in 2008 demonstrated no CAD. Echo in 2011 with EF 15%. Admitted from the office 06/09/15 for acute on chronic CHF. Massively volume overloaded and symptomatic in the office. Repeat echo performed in office prior to admission. EF unchanged at 15-20%.   Started on milrinone 0.25. Initial Coox 40%.    Yesterday milrinone turned down to 0.125. Coox this morning 78%  CVP 7. Weight down almost 30 pounds.   Diuresing well. No weight recorded yet today. - almost 4L.   Started on prednisone for acute R knee gout on 2/18. Uric acid 14.2. Pain resolved.   Creatinine 1.80 -> 1.88 ->1.68.-> 1.66 -> 1.63  -> 1.56  Objective:   Weight Range: 80.1 kg (176 lb 9.4 oz) Body mass index is 28.52 kg/(m^2).   Vital Signs:   Temp:  [97.3 F (36.3 C)-97.8 F (36.6 C)] 97.8 F (36.6 C) (02/19 2339) Pulse Rate:  [86-104] 98 (02/20 0000) Resp:  [20-27] 22 (02/20 0000) BP: (87-112)/(45-62) 99/53 mmHg (02/20 0000) SpO2:  [97 %-99 %] 98 % (02/20 0000) Weight:  [80.1 kg (176 lb 9.4 oz)] 80.1 kg (176 lb 9.4 oz) (02/19 1100) Last BM Date: 06/10/15  Weight change: Filed Weights   06/12/15 0358 06/13/15 0400 06/15/15 1100  Weight: 88.089 kg (194 lb 3.2 oz) 84.7 kg (186 lb 11.7 oz) 80.1 kg (176 lb 9.4 oz)    Intake/Output:   Intake/Output Summary (Last 24 hours) at 06/16/15 0342 Last data filed at 06/16/15 0100  Gross per 24 hour  Intake 857.48 ml  Output   3525 ml  Net -2667.52 ml     Physical Exam: General:  Lying in bed . NAD HEENT: normal Neck: supple. JVP7  Carotids 2+ bilat; no bruits. No thyromegaly or nodule noted. Cor: PMI nondisplaced. RRR. III/IV MR, +S3 Lungs: clear Abdomen: soft, NT, nondistended, no HSM. No bruits or masses. +BS  Extremities: no cyanosis, clubbing, rash, tr-1+ woody  edema Neuro: alert & orientedx3, cranial nerves grossly intact. moves all 4 extremities w/o difficulty. Affect pleasant   Telemetry: reviewed personally, NSR 90-100  Labs: CBC  Recent Labs  06/16/15 0200  WBC 11.8*  HGB 11.0*  HCT 32.9*  MCV 82.5  PLT 177   Basic Metabolic Panel  Recent Labs  06/13/15 1130  06/15/15 0429 06/16/15 0200  NA  --   < > 133* 131*  K  --   < > 4.2 5.2*  CL  --   < > 83* 78*  CO2  --   < > 38* 40*  GLUCOSE  --   < > 177* 226*  BUN  --   < > 44* 52*  CREATININE  --   < > 1.63* 1.56*  CALCIUM  --   < > 8.7* 9.1  MG 2.2  --   --   --   < > = values in this interval not displayed. Liver Function Tests No results for input(s): AST, ALT, ALKPHOS, BILITOT, PROT, ALBUMIN in the last 72 hours. No results for input(s): LIPASE, AMYLASE in the last 72 hours. Cardiac Enzymes No results for input(s): CKTOTAL, CKMB, CKMBINDEX, TROPONINI in the last 72 hours.  BNP: BNP (last 3 results)  Recent Labs  06/09/15 1920  BNP >4500.0*    ProBNP (last 3  results) No results for input(s): PROBNP in the last 8760 hours.   D-Dimer No results for input(s): DDIMER in the last 72 hours. Hemoglobin A1C No results for input(s): HGBA1C in the last 72 hours. Fasting Lipid Panel No results for input(s): CHOL, HDL, LDLCALC, TRIG, CHOLHDL, LDLDIRECT in the last 72 hours. Thyroid Function Tests No results for input(s): TSH, T4TOTAL, T3FREE, THYROIDAB in the last 72 hours.  Invalid input(s): FREET3  Other results:     Imaging/Studies:  No results found.  Latest Echo  Latest Cath   Medications:     Scheduled Medications: . antiseptic oral rinse  7 mL Mouth Rinse BID  . aspirin  81 mg Oral Daily  . enoxaparin (LOVENOX) injection  30 mg Subcutaneous Q24H  . isosorbide-hydrALAZINE  0.5 tablet Oral TID  . metolazone  2.5 mg Oral BID  . potassium chloride  40 mEq Oral BID  . predniSONE  40 mg Oral Q breakfast  . sodium chloride flush  3 mL  Intravenous Q12H  . spironolactone  12.5 mg Oral Daily    Infusions: . furosemide (LASIX) infusion 15 mg/hr (06/16/15 0000)  . milrinone 0.125 mcg/kg/min (06/16/15 0000)    PRN Medications: sodium chloride, acetaminophen, ondansetron (ZOFRAN) IV, sodium chloride flush, sodium chloride flush   Assessment   1. Acute on chronic systolic CHF 2/2 NICM 2. HTN 3. AKI on CKD III 4. Severe MR 5. AICD 6. Hyperkalemia/hyponatremia 7. R knee pain - likely acute gout.  8. Cellulitis - resolved.  9. LBBB -   Plan    Much improved. Weight down nearly 30 pounds. Stop lasix gtt and milrinone at 2pm today. Start torsemide 40 bid. Increase bidil to 1 tab tid as BP tolerates. BP too low for ACE or b-blocker. Gout resolved on prednisone. Start allopurinol. K high. Stop kcl. Hold spiro.   Remove foley once lasix off.  Keep in SDU today. Tele in am.    Length of Stay: 7   Arvilla Meres MD  06/16/2015, 3:42 AM  Advanced Heart Failure Team Pager [phone] (M-F; 7a - 4p)  Please contact [facility] for night-coverage after hours (4p -7a ) and weekends on [email or web address]

## 2015-06-16 NOTE — Progress Notes (Signed)
Physical Therapy Treatment Patient Details Name: Adaira Centola MRN: [id number] DOB: 04/14/1935 Today's Date: 06/16/2015    History of Present Illness Michele Caldwell is a 81 y.o. female with a hx of HFrEF secondary to NICM, LBBB, s/p AICD, CKD. LHC in 2008 demonstrated no CAD. Echo in 2011 with EF 15%.Admitted from the office 06/09/15 for acute on chronic CHF. Massively volume overloaded and symptomatic in the office.    PT Comments    Patient seen for progression of activity. Patient mobilized on room air with saturations remaining stable. Patient tolerated well. Educated on increased activity recommendations. Continue current POC. Will see as inicated and progress as tolerated.   Follow Up Recommendations  Home health PT;Supervision - Intermittent     Equipment Recommendations  None recommended by PT    Recommendations for Other Services       Precautions / Restrictions Precautions Precautions: Fall Restrictions Weight Bearing Restrictions: No    Mobility  Bed Mobility Overal bed mobility: Needs Assistance Bed Mobility: Sit to Supine       Sit to supine: Supervision   General bed mobility comments: OOB in chair  Transfers Overall transfer level: Needs assistance Equipment used: Rolling walker (2 wheeled) Transfers: Sit to/from Stand Sit to Stand: Min guard         General transfer comment: increased time and effort to elevate to standing from recliner. VCs for hand placement and positioning  Ambulation/Gait Ambulation/Gait assistance: Min guard Ambulation Distance (Feet): 210 Feet Assistive device: Rolling walker (2 wheeled);None Gait Pattern/deviations: Step-through pattern;Decreased stride length;Shuffle Gait velocity: decreased   General Gait Details: very slow gait speed despite cues for increased cadence. VCs for upright posture.    Stairs            Wheelchair Mobility    Modified Rankin (Stroke Patients Only)       Balance    Sitting-balance support: Feet supported Sitting balance-Leahy Scale: Good     Standing balance support: No upper extremity supported Standing balance-Leahy Scale: Fair                      Cognition Arousal/Alertness: Awake/alert Behavior During Therapy: Flat affect Overall Cognitive Status: Within Functional Limits for tasks assessed                      Exercises      General Comments General comments (skin integrity, edema, etc.): Educated on increased cadence and normalized gait speed.      Pertinent Vitals/Pain Pain Assessment: No/denies pain    Home Living                      Prior Function            PT Goals (current goals can now be found in the care plan section) Acute Rehab PT Goals Patient Stated Goal: none stated PT Goal Formulation: With patient Time For Goal Achievement: 06/25/15 Potential to Achieve Goals: Good Progress towards PT goals: Progressing toward goals    Frequency  Min 3X/week    PT Plan Current plan remains appropriate    Co-evaluation             End of Session Equipment Utilized During Treatment: Gait belt Activity Tolerance: Patient tolerated treatment well Patient left: in bed;with call bell/phone within reach;with bed alarm set     Time: 1500-1521 PT Time Calculation (min) (ACUTE ONLY): 21 min  Charges:  $Gait Training: 8-22 mins  G CodesFabio Asa 29-Jun-2015, 4:35 PM Charlotte Crumb, PT DPT  [phone]

## 2015-06-17 LAB — BASIC METABOLIC PANEL
ANION GAP: 15 (ref 5–15)
BUN: 65 mg/dL — AB (ref 6–20)
CO2: 42 mmol/L — AB (ref 22–32)
CREATININE: 1.85 mg/dL — AB (ref 0.44–1.00)
Calcium: 9.2 mg/dL (ref 8.9–10.3)
Chloride: 78 mmol/L — ABNORMAL LOW (ref 101–111)
GFR calc Af Amer: 29 mL/min — ABNORMAL LOW (ref >60)
GFR calc non Af Amer: 25 mL/min — ABNORMAL LOW (ref >60)
Glucose, Bld: 126 mg/dL — ABNORMAL HIGH (ref 65–99)
Potassium: 4.3 mmol/L (ref 3.5–5.1)
Sodium: 135 mmol/L (ref 135–145)

## 2015-06-17 LAB — CARBOXYHEMOGLOBIN
Carboxyhemoglobin: 1.3 % (ref 0.5–1.5)
Methemoglobin: 0.8 % (ref 0.0–1.5)
O2 SAT: 65.7 %
Total hemoglobin: 11.6 g/dL — ABNORMAL LOW (ref 12.0–16.0)

## 2015-06-17 MED ORDER — TORSEMIDE 20 MG PO TABS
40.0000 mg | ORAL_TABLET | Freq: Once | ORAL | Status: AC
  Administered 2015-06-17: 40 mg via ORAL
  Filled 2015-06-17: qty 2

## 2015-06-17 NOTE — Progress Notes (Signed)
Occupational Therapy Treatment Patient Details Name: Michele Caldwell MRN: [id number] DOB: [birth year] Today's Date: 06/17/2015    History of present illness Michele Caldwell is a 81 y.o. female with a hx of HFrEF secondary to NICM, LBBB, s/p AICD, CKD. LHC in 2008 demonstrated no CAD. Echo in 2011 with EF 15%.Admitted from the office 06/09/15 for acute on chronic CHF. Massively volume overloaded and symptomatic in the office.   OT comments  Pt very concerned over husband's health and non compliance for doctor recommendations. Pt needed redirection to her own care this session and energy conservation education. Pt provided handout and agreeable to making modifications. Pt demonstrates HR 143 with sit<>stand task with rollator education.    Follow Up Recommendations  Supervision/Assistance - 24 hour;No OT follow up    Equipment Recommendations  Other (comment);3 in 1 bedside comode;Tub/shower seat (rollator)    Recommendations for Other Services      Precautions / Restrictions Precautions Precautions: Fall       Mobility Bed Mobility               General bed mobility comments: in chair on arrival  Transfers Overall transfer level: Needs assistance Equipment used: 4-wheeled walker Transfers: Sit to/from Stand Sit to Stand: Min guard         General transfer comment: needed incr time and cues for hand placement    Balance                                   ADL                         Lower Body Dressing Details (indicate cue type and reason): able to cross bil le for LB dressing. Educated on avoiding bending to allow oxygen saturations to remain higher               General ADL Comments: pt educated on energy conservation handout. Pt very fixated on discussion husband his unwillingness to follow doctors instructions and his recent multiple admissions. pt reports husband just left [facility] this last week and family is having to care for him at  home. Pt plans to stay with granddaughter if spouse continues to be non compliant with doctors orders. Pt educated on Rollator and allowed to practice with department 4ww. Pt educated on hand brakes and seat use. Educated on using this in the home and in the community.      Vision                     Perception     Praxis      Cognition   Behavior During Therapy: Flat affect Overall Cognitive Status: Within Functional Limits for tasks assessed                       Extremity/Trunk Assessment               Exercises     Shoulder Instructions       General Comments      Pertinent Vitals/ Pain       Pain Assessment: No/denies pain  Home Living  Prior Functioning/Environment              Frequency Min 2X/week     Progress Toward Goals  OT Goals(current goals can now be found in the care plan section)  Progress towards OT goals: Progressing toward goals  Acute Rehab OT Goals Patient Stated Goal: none stated OT Goal Formulation: With patient Time For Goal Achievement: 06/26/15 Potential to Achieve Goals: Good  Plan Discharge plan remains appropriate    Co-evaluation                 End of Session Equipment Utilized During Treatment: Oxygen   Activity Tolerance Patient tolerated treatment well (HR 143)   Patient Left in chair;with call bell/phone within reach   Nurse Communication Mobility status;Precautions        Time: 6468-0321 OT Time Calculation (min): 26 min  Charges: OT General Charges $OT Visit: 1 Procedure OT Treatments $Self Care/Home Management : 8-22 mins  Boone Master B 06/17/2015, 2:45 PM  [name]   OTR/L Pager: [phone] Office: [phone] .

## 2015-06-17 NOTE — Progress Notes (Signed)
CARDIAC REHAB PHASE I   PRE:  Rate/Rhythm: 96 SR  BP:  Sitting: 99/54        SaO2: 98 2L, 95 RA  MODE:  Ambulation: 210 ft   POST:  Rate/Rhythm: 107 ST c/ PVCs  BP:  Sitting: 107/54         SaO2: 89-90 RA, 92-96% on 2L  Pt up in recliner, c/o R knee pain, just received tylenol. Pt ambulated 210 ft on RA, rolling walker, gait belt, slow, fairly steady gait, tolerated well. Pt states knee pain improved with ambulation, denies any other symptoms or complaints. Pt sats 89-90% on RA with ambulation, upon return to room, pt placed on 2L O2, sats increased to 92-96% on 2L. RN and case manager notified. Reviewed CHF education, exercise guidelines and CRP2. Pt verbalized understanding. Pt agrees to phase 2 cardiac rehab referral, will send to [facility] per pt request. Pt to recliner after walk, call bell within reach. Will follow as schedule permits.  [phone] Joylene Grapes, RN, BSN 06/17/2015 11:01 AM

## 2015-06-17 NOTE — Progress Notes (Signed)
Transferred to 3east room4 by wheelchair, stable, report given to RN, belongings with pt.

## 2015-06-17 NOTE — Care Management Note (Signed)
Case Management Note  Patient Details  Name: Michele Caldwell MRN: [id number] Date of Birth: 1935/03/17  Subjective/Objective:      Adm w chf             Action/Plan:lives in [place]. No hx of hhc. No pref to hhc agencies.   Expected Discharge Date:                  Expected Discharge Plan:  Home w Home Health Services  In-House Referral:     Discharge planning Services  CM Consult  Post Acute Care Choice:  Home Health Choice offered to:  Patient  DME Arranged:    DME Agency:     HH Arranged:  RN, PT, Disease Management HH Agency:  [facility]  Status of Service:  Completed, signed off  Medicare Important Message Given:  Yes Date Medicare IM Given:    Medicare IM give by:    Date Additional Medicare IM Given:    Additional Medicare Important Message give by:     If discussed at Long Length of Stay Meetings, dates discussed:    Additional Comments:[facility] in [place] can provide hhrn and hhpt , ph  [phone], fax [phone]. Spoke w diane at [facility]. Await final orders. Hanley Hays, RN 06/17/2015, 11:23 AM

## 2015-06-17 NOTE — Progress Notes (Addendum)
Advanced Heart Failure Rounding Note  PCP: None Primary Cardiologist: Dr Graciela Husbands  Subjective:    Michele Caldwell is a 81 y.o. female with a hx of HFrEF secondary to NICM, LBBB, s/p AICD, CKD. LHC in 2008 demonstrated no CAD. Echo in 2011 with EF 15%. Admitted from the office 06/09/15 for acute on chronic CHF. Massively volume overloaded and symptomatic in the office. Repeat echo performed in office prior to admission. EF unchanged at 15-20%.   Started on milrinone 0.25. Initial Coox 40%.    Yesterday milrinone and lasix drip stopped. Todays CO-OX is 65.7%.Denies SOB/Orthopnea.   Completed prednisone for acute R knee gout on 2/18. Uric acid 14.2. Started on allopurinol. Pain resolved.   Creatinine 1.80 -> 1.88 ->1.68.-> 1.66 -> 1.63  -> 1.56-->1.85   Objective:   Weight Range: 168 lb (76.204 kg) Body mass index is 27.13 kg/(m^2).   Vital Signs:   Temp:  [97.7 F (36.5 C)-98.3 F (36.8 C)] 97.9 F (36.6 C) (02/21 0728) Pulse Rate:  [93-109] 93 (02/21 0728) Resp:  [20-25] 24 (02/21 0728) BP: (92-111)/(46-63) 101/61 mmHg (02/21 0728) SpO2:  [90 %-98 %] 97 % (02/21 0728) Weight:  [168 lb (76.204 kg)] 168 lb (76.204 kg) (02/21 0430) Last BM Date: 06/16/15  Weight change: Filed Weights   06/15/15 1100 06/16/15 0400 06/17/15 0430  Weight: 176 lb 9.4 oz (80.1 kg) 165 lb 2 oz (74.9 kg) 168 lb (76.204 kg)    Intake/Output:   Intake/Output Summary (Last 24 hours) at 06/17/15 0744 Last data filed at 06/17/15 0500  Gross per 24 hour  Intake  409.8 ml  Output   3675 ml  Net -3265.2 ml     Physical Exam: CVP 7 General:  Sitting on the side of the bed.  NAD HEENT: normal Neck: supple. JVP 6-7  Carotids 2+ bilat; no bruits. No thyromegaly or nodule noted. Cor: PMI nondisplaced. RRR. III/IV MR, +S3 Lungs: clear Abdomen: soft, NT, nondistended, no HSM. No bruits or masses. +BS  Extremities: no cyanosis, clubbing, rash, no edema. RUE PICC  Neuro: alert & orientedx3, cranial  nerves grossly intact. moves all 4 extremities w/o difficulty. Affect pleasant   Telemetry: reviewed personally, NSR 90-100  Labs: CBC  Recent Labs  06/16/15 0200  WBC 11.8*  HGB 11.0*  HCT 32.9*  MCV 82.5  PLT 177   Basic Metabolic Panel  Recent Labs  06/16/15 0200 06/17/15 0435  NA 131* 135  K 5.2* 4.3  CL 78* 78*  CO2 40* 42*  GLUCOSE 226* 126*  BUN 52* 65*  CREATININE 1.56* 1.85*  CALCIUM 9.1 9.2   Liver Function Tests No results for input(s): AST, ALT, ALKPHOS, BILITOT, PROT, ALBUMIN in the last 72 hours. No results for input(s): LIPASE, AMYLASE in the last 72 hours. Cardiac Enzymes No results for input(s): CKTOTAL, CKMB, CKMBINDEX, TROPONINI in the last 72 hours.  BNP: BNP (last 3 results)  Recent Labs  06/09/15 1920  BNP >4500.0*    ProBNP (last 3 results) No results for input(s): PROBNP in the last 8760 hours.   D-Dimer No results for input(s): DDIMER in the last 72 hours. Hemoglobin A1C No results for input(s): HGBA1C in the last 72 hours. Fasting Lipid Panel No results for input(s): CHOL, HDL, LDLCALC, TRIG, CHOLHDL, LDLDIRECT in the last 72 hours. Thyroid Function Tests No results for input(s): TSH, T4TOTAL, T3FREE, THYROIDAB in the last 72 hours.  Invalid input(s): FREET3  Other results:     Imaging/Studies:  No results  found.  Latest Echo  Latest Cath   Medications:     Scheduled Medications: . allopurinol  200 mg Oral Daily  . antiseptic oral rinse  7 mL Mouth Rinse BID  . aspirin  81 mg Oral Daily  . enoxaparin (LOVENOX) injection  30 mg Subcutaneous Q24H  . isosorbide-hydrALAZINE  1 tablet Oral TID  . sodium chloride flush  3 mL Intravenous Q12H  . torsemide  40 mg Oral BID    Infusions:    PRN Medications: sodium chloride, acetaminophen, ondansetron (ZOFRAN) IV, sodium chloride flush, sodium chloride flush   Assessment   1. Acute on chronic systolic CHF 2/2 NICM 2. HTN 3. AKI on CKD III 4. Severe MR:  Functional.  5. AICD 6. Hyperkalemia/hyponatremia 7. R knee pain - likely acute gout.  8. Cellulitis - resolved.  9. LBBB -   Plan    Stable off milrinnone 66%. Renal function trending up. CVP 7. Start torsemide 40 mg daily. Check BMET in am. Continue bidil to 1 tab tid . No  ACE or b-blocker. Hold on spiro.   Gout resolved on prednisone. Continue  allopurinol.    Transfer to tele.  Possible D/C in am. HH ordered.   Length of Stay: 8  Amy Clegg NP-C   06/17/2015, 7:44 AM  Advanced Heart Failure Team Pager [phone] (M-F; 7a - 4p)  Please contact [facility] for night-coverage after hours (4p -7a ) and weekends on [email or web address]  Patient seen with NP, agree with the above note.  IV Lasix stopped yesterday.  Creatinine higher at 1.8 today.  Good co-ox at 66%.  Patient feels good, CVP 7.  - Continue Bidil 1 tab tid.  - Continue torsemide 40 mg daily.  - Hold off on digoxin with rising creatinine.  - Consider Corlanor with HR in 100s/sinus tachy.   Can go to telemetry today.  Will need close watch on renal function.  Hopefully home soon.   Marca Ancona 06/17/2015 9:01 AM

## 2015-06-17 NOTE — Telephone Encounter (Signed)
Patient followed up with Tereso Newcomer on 2/13.

## 2015-06-18 LAB — BASIC METABOLIC PANEL
ANION GAP: 11 (ref 5–15)
BUN: 63 mg/dL — AB (ref 6–20)
CHLORIDE: 80 mmol/L — AB (ref 101–111)
CO2: 43 mmol/L — AB (ref 22–32)
Calcium: 8.9 mg/dL (ref 8.9–10.3)
Creatinine, Ser: 1.69 mg/dL — ABNORMAL HIGH (ref 0.44–1.00)
GFR calc Af Amer: 32 mL/min — ABNORMAL LOW (ref >60)
GFR calc non Af Amer: 28 mL/min — ABNORMAL LOW (ref >60)
GLUCOSE: 116 mg/dL — AB (ref 65–99)
POTASSIUM: 3.7 mmol/L (ref 3.5–5.1)
Sodium: 134 mmol/L — ABNORMAL LOW (ref 135–145)

## 2015-06-18 LAB — CARBOXYHEMOGLOBIN
CARBOXYHEMOGLOBIN: 1.6 % — AB (ref 0.5–1.5)
METHEMOGLOBIN: 0.7 % (ref 0.0–1.5)
O2 Saturation: 93.8 %
Total hemoglobin: 12 g/dL (ref 12.0–16.0)

## 2015-06-18 MED ORDER — ISOSORB DINITRATE-HYDRALAZINE 20-37.5 MG PO TABS: 1.0000 | ORAL_TABLET | Freq: Three times a day (TID) | ORAL | Status: DC

## 2015-06-18 MED ORDER — DIGOXIN 125 MCG PO TABS
0.0625 mg | ORAL_TABLET | Freq: Every day | ORAL | Status: DC
  Administered 2015-06-18: 0.0625 mg via ORAL
  Filled 2015-06-18: qty 1

## 2015-06-18 MED ORDER — TORSEMIDE 20 MG PO TABS: 40.0000 mg | ORAL_TABLET | Freq: Every day | ORAL | Status: DC

## 2015-06-18 MED ORDER — ALLOPURINOL 100 MG PO TABS: 200.0000 mg | ORAL_TABLET | Freq: Every day | ORAL | Status: AC

## 2015-06-18 NOTE — Discharge Summary (Signed)
Advanced Heart Failure Team  Discharge Summary   Patient ID: Michele Caldwell MRN: [id number], DOB/AGE: 79-Apr-1936 79 y.o. Admit date: 06/09/2015 D/C date:     06/18/2015   Primary Discharge Diagnoses:  1. Acute on chronic systolic CHF 2/2 NICM 2. HTN 3. AKI on CKD III 4. Severe MR 5. AICD 6. Hyperkalemia 7. Gout- Uric Acid 14.2 on 06/14/2015   Hospital Course:   Michele Caldwell is a 81 y.o. female with a hx of HFrEF secondary to NICM, LBBB, s/p AICD, CKD. LHC in 2008 demonstrated no CAD.  Admitted with marked volume overload. Diuresed with lasix with sluggish results so PICC line was to evaluate mixed venous saturation and CVP. Mixed venous saturation was 40% so milrinone 0.25 mcg was started and she continued on IV lasix.  Hemodynamics and diuresis improved with the addition of milrinone. As she improved milrinone was weaned off. IV lasix stopped once euvolemic and she transitioned to torsemide 40 mg daily. Overall she diuresed 30 pounds. She will not be on bb for now. Continue bidil  1 tab three times a day. She will not be on Ace/Spiro due to CKD.   Complained of R knee pain. Uric acid was 14.2. Placed on prednisone for 3 days then  started on allopurinol. Renal function followed closely with creatinine peak 1.88 and on the day of discharge creatinine was 1.69.  For now keep off K with recent hyperkalemia (K 5.3) while hospitalized. Plan to check BMET next week.   [facility] to follow for HHRN/HHPT. Plan to follow closely in the HF clinic. Consider BB at that time.   Discharge Weight: 167 pounds  Discharge Vitals: Blood pressure 104/60, pulse 62, temperature 98.4 F (36.9 C), temperature source Oral, resp. rate 18, height  (1.676 m), weight 167 lb 4.8 oz (75.887 kg), SpO2 95 %.  Labs: Lab Results  Component Value Date   WBC 11.8* 06/16/2015   HGB 11.0* 06/16/2015   HCT 32.9* 06/16/2015   MCV 82.5 06/16/2015   PLT 177 06/16/2015     Recent Labs Lab 06/18/15 0550  NA 134*  K  3.7  CL 80*  CO2 43*  BUN 63*  CREATININE 1.69*  CALCIUM 8.9  GLUCOSE 116*   No results found for: CHOL, HDL, LDLCALC, TRIG BNP (last 3 results)  Recent Labs  06/09/15 1920  BNP >4500.0*    ProBNP (last 3 results) No results for input(s): PROBNP in the last 8760 hours.   Diagnostic Studies/Procedures   No results found.  Discharge Medications     Medication List    STOP taking these medications        carvedilol 12.5 MG tablet  Commonly known as:  COREG     furosemide 40 MG tablet  Commonly known as:  LASIX     spironolactone 25 MG tablet  Commonly known as:  ALDACTONE      TAKE these medications        allopurinol 100 MG tablet  Commonly known as:  ZYLOPRIM  Take 2 tablets (200 mg total) by mouth daily.     aspirin 81 MG tablet  Take 81 mg by mouth daily.     isosorbide-hydrALAZINE 20-37.5 MG tablet  Commonly known as:  BIDIL  Take 1 tablet by mouth 3 (three) times daily.     torsemide 20 MG tablet  Commonly known as:  DEMADEX  Take 2 tablets (40 mg total) by mouth daily.        Disposition  The patient will be discharged in stable condition to home. Discharge Instructions    Amb Referral to Cardiac Rehabilitation    Complete by:  As directed   [place]  Diagnosis:  Heart Failure (see criteria below)     Diet - low sodium heart healthy    Complete by:  As directed      Heart Failure patients record your daily weight using the same scale at the same time of day    Complete by:  As directed      Increase activity slowly    Complete by:  As directed      PICC line removal    Complete by:  As directed           Follow-up Information    Follow up with [facility] On 06/25/2015.   Specialty:  Cardiology   Why:  at 240 pm for post hospital follow up. Please bring all of your medications to your visit.  The code for patient parking is 0010.   Contact information:   [address] [id number] mc [place] [address] [phone]        Duration of Discharge Encounter: Greater than 35 minutes   Signed, Tonye Becket  06/18/2015, 2:26 PM   Patient seen and examined with Tonye Becket, NP. We discussed all aspects of the encounter. I agree with the assessment and plan as stated above.   She is stable for d/c today. Continue current meds. Would not use digoxin give age and fluctuating renal function. Low threshold to add ivabradine. Will f/u in HF clinic next week. HHRN arranged.   Jeryl Umholtz,MD 3:55 PM

## 2015-06-18 NOTE — Progress Notes (Signed)
Advanced Heart Failure Rounding Note  PCP: None Primary Cardiologist: Dr Graciela Husbands  Subjective:    Michele Caldwell is a 81 y.o. female with a hx of HFrEF secondary to NICM, LBBB, s/p AICD, CKD. LHC in 2008 demonstrated no CAD. Echo in 2011 with EF 15%. Admitted from the office 06/09/15 for acute on chronic CHF. Massively volume overloaded and symptomatic in the office. Repeat echo performed in office prior to admission. EF unchanged at 15-20%.   Started on milrinone 0.25. Initial Coox 40%.    Milrinone and lasix drip stopped 06/16/15. Todays CO-OX is 93.8, falsely elevated. Will not recheck. She is not having any symptoms and has previously stated she would not want to go home with PICC.   Completed prednisone for acute R knee gout on 2/18. Uric acid 14.2. Started on allopurinol. Pain resolved.   Creatinine 1.80 -> 1.88 ->1.68.-> 1.66 -> 1.63  -> 1.56-->1.85 --> 1.69  Objective:   Weight Range: 167 lb 4.8 oz (75.887 kg) Body mass index is 27.02 kg/(m^2).   Vital Signs:   Temp:  [97.5 F (36.4 C)-98.4 F (36.9 C)] 97.6 F (36.4 C) (02/22 0458) Pulse Rate:  [95-117] 117 (02/22 0458) Resp:  [18-24] 18 (02/22 0458) BP: (97-107)/(54-67) 100/67 mmHg (02/22 0458) SpO2:  [94 %-98 %] 95 % (02/22 0458) Weight:  [167 lb 4.8 oz (75.887 kg)] 167 lb 4.8 oz (75.887 kg) (02/22 0458) Last BM Date: 06/17/15  Weight change: Filed Weights   06/16/15 0400 06/17/15 0430 06/18/15 0458  Weight: 165 lb 2 oz (74.9 kg) 168 lb (76.204 kg) 167 lb 4.8 oz (75.887 kg)    Intake/Output:   Intake/Output Summary (Last 24 hours) at 06/18/15 0830 Last data filed at 06/18/15 0458  Gross per 24 hour  Intake    540 ml  Output   1900 ml  Net  -1360 ml     Physical Exam: General:  Sitting on the side of the bed.  NAD HEENT: normal Neck: supple. JVP 6-7  Carotids 2+ bilat; no bruits. No thyromegaly or nodule noted. Cor: PMI nondisplaced. RRR. III/IV MR, +S3 Lungs: CTAB, normal effort Abdomen: soft, NT,  ND, no HSM. No bruits or masses. +BS  Extremities: no cyanosis, clubbing, rash, no edema. RUE PICC  Neuro: alert & orientedx3, cranial nerves grossly intact. moves all 4 extremities w/o difficulty. Affect pleasant   Telemetry: reviewed personally, NSR 100s up to 110s  Labs: CBC  Recent Labs  06/16/15 0200  WBC 11.8*  HGB 11.0*  HCT 32.9*  MCV 82.5  PLT 177   Basic Metabolic Panel  Recent Labs  06/17/15 0435 06/18/15 0550  NA 135 134*  K 4.3 3.7  CL 78* 80*  CO2 42* 43*  GLUCOSE 126* 116*  BUN 65* 63*  CREATININE 1.85* 1.69*  CALCIUM 9.2 8.9   Liver Function Tests No results for input(s): AST, ALT, ALKPHOS, BILITOT, PROT, ALBUMIN in the last 72 hours. No results for input(s): LIPASE, AMYLASE in the last 72 hours. Cardiac Enzymes No results for input(s): CKTOTAL, CKMB, CKMBINDEX, TROPONINI in the last 72 hours.  BNP: BNP (last 3 results)  Recent Labs  06/09/15 1920  BNP >4500.0*    ProBNP (last 3 results) No results for input(s): PROBNP in the last 8760 hours.   D-Dimer No results for input(s): DDIMER in the last 72 hours. Hemoglobin A1C No results for input(s): HGBA1C in the last 72 hours. Fasting Lipid Panel No results for input(s): CHOL, HDL, LDLCALC, TRIG, CHOLHDL, LDLDIRECT  in the last 72 hours. Thyroid Function Tests No results for input(s): TSH, T4TOTAL, T3FREE, THYROIDAB in the last 72 hours.  Invalid input(s): FREET3  Other results:     Imaging/Studies:  No results found.  Latest Echo  Latest Cath   Medications:     Scheduled Medications: . allopurinol  200 mg Oral Daily  . antiseptic oral rinse  7 mL Mouth Rinse BID  . aspirin  81 mg Oral Daily  . enoxaparin (LOVENOX) injection  30 mg Subcutaneous Q24H  . isosorbide-hydrALAZINE  1 tablet Oral TID  . sodium chloride flush  3 mL Intravenous Q12H    Infusions:    PRN Medications: sodium chloride, acetaminophen, ondansetron (ZOFRAN) IV, sodium chloride flush, sodium  chloride flush   Assessment   1. Acute on chronic systolic CHF 2/2 NICM 2. HTN 3. AKI on CKD III 4. Severe MR: Functional.  5. AICD 6. Hyperkalemia/hyponatremia 7. R knee pain - likely acute gout.  8. Cellulitis - resolved.  9. LBBB -   Plan    Stable off milrinnone. Renal function coming back down on torsemide 40 mg daily.   Continue bidil 1 tab tid. Will not increase with soft BP.  No ACE or b-blocker with AKI/low output. Hold on spiro with AKI.  Could consider corlanor with HR in 100-110s.   Will start on low dose digoxin 0.0625 mg. Can check level at follow up next week .  Gout resolved on prednisone. Continue allopurinol.    Has HHRN ordered for home. Home today vs tomorrow with further monitoring on po meds. Has HF clinic follow up scheduled for next week.   Length of Stay: [address]  Graciella Freer PA-C   06/18/2015, 8:30 AM  Advanced Heart Failure Team Pager [phone] (M-F; 7a - 4p)  Please contact [facility] for night-coverage after hours (4p -7a ) and weekends on [email or web address]  Patient seen and examined with Otilio Saber, PA-C. We discussed all aspects of the encounter. I agree with the assessment and plan as stated above.   She is stable for d/c today. Continue current meds. Would not use digoxin give age and fluctuating renal function. Low threshold to add ivabradine. Will f/u in HF clinic next week. HHRN arranged.   Bensimhon, Daniel,MD 1:30 PM

## 2015-06-18 NOTE — Discharge Summary (Signed)
Pt got discharged to home, discharge instructions provided and patient showed understanding to it, IV taken out,Telemonitor DC,pt left unit in wheelchair with all of the belongings accompanied with a family member (daughter) 

## 2015-06-18 NOTE — Progress Notes (Signed)
Post PICC removal instructions given to patient and family member, do not remove dressing for 24 hours, do not get dressing wet and what to do if bleeding occurs. Michele Caldwell Keylor Rands-RN VAST

## 2015-06-18 NOTE — Progress Notes (Signed)
Noted HHC arranged by Pablo Ledger RN, patient requested [facility]; Ashely with [facility] called and updated, they will see patient tomorrow for start of care. 3:1 ordered and will be delivered to room today prior to discharging home; Michele Caldwell [phone]

## 2015-06-18 NOTE — Progress Notes (Signed)
Physical Therapy Treatment Patient Details Name: Michele Caldwell MRN: [id number] DOB: April 04, 1935 Today's Date: 06/18/2015    History of Present Illness Michele Caldwell is a 81 y.o. female with a hx of HFrEF secondary to NICM, LBBB, s/p AICD, CKD. LHC in 2008 demonstrated no CAD. Echo in 2011 with EF 15%.Admitted from the office 06/09/15 for acute on chronic CHF. Massively volume overloaded and symptomatic in the office.    PT Comments    Patient seen for continued mobility progression. Patient ambulated on room air with saturations remaining 93% or greater. Multiple standing rest breaks required secondary to elevation in HR low 140s with VTACH reading on monitor. Nsg Michele Caldwell) notified immediately. Current POC remains appropriate.   Follow Up Recommendations  Home health PT;Supervision - Intermittent     Equipment Recommendations  None recommended by PT    Recommendations for Other Services       Precautions / Restrictions Precautions Precautions: Fall Restrictions Weight Bearing Restrictions: No    Mobility  Bed Mobility               General bed mobility comments: OOB in chair  Transfers Overall transfer level: Needs assistance Equipment used: Rolling walker (2 wheeled) Transfers: Sit to/from Stand Sit to Stand: Supervision         General transfer comment: VCs for hand placement to elevate from standing from chair.   Ambulation/Gait Ambulation/Gait assistance: Supervision Ambulation Distance (Feet): 300 Feet Assistive device: Rolling walker (2 wheeled);None Gait Pattern/deviations: Step-through pattern;Decreased stride length;Shuffle Gait velocity: decreased Gait velocity interpretation: <1.8 ft/sec, indicative of risk for recurrent falls General Gait Details: continues to require cues for increased cadence. Patient with 5 standing rest breaks secondary to elevation in HR 140s VTACH reachind on monitor, Nsg informed immediately upon conclusion of session.  Ambulate don room air with saturations 93% on room air at lowest point with ambulation.    Stairs            Wheelchair Mobility    Modified Rankin (Stroke Patients Only)       Balance   Sitting-balance support: Feet supported Sitting balance-Leahy Scale: Good       Standing balance-Leahy Scale: Fair                      Cognition Arousal/Alertness: Awake/alert Behavior During Therapy: Flat affect Overall Cognitive Status: Within Functional Limits for tasks assessed                      Exercises      General Comments        Pertinent Vitals/Pain      Home Living                      Prior Function            PT Goals (current goals can now be found in the care plan section) Acute Rehab PT Goals Patient Stated Goal: none stated PT Goal Formulation: With patient Time For Goal Achievement: 06/25/15 Potential to Achieve Goals: Good Progress towards PT goals: Progressing toward goals    Frequency  Min 3X/week    PT Plan Current plan remains appropriate    Co-evaluation             End of Session Equipment Utilized During Treatment: Gait belt Activity Tolerance: Patient tolerated treatment well Patient left: in chair;with call bell/phone within reach;with chair alarm set     Time: 4098-1191 PT  Time Calculation (min) (ACUTE ONLY): 18 min  Charges:  $Gait Training: 8-22 mins                    G CodesFabio Caldwell 07-02-2015, 1:17 PM Michele Caldwell, PT DPT  [phone]

## 2015-06-20 ENCOUNTER — Inpatient Hospital Stay (HOSPITAL_COMMUNITY): Payer: Medicare Other

## 2015-06-20 ENCOUNTER — Encounter (HOSPITAL_COMMUNITY): Payer: Self-pay | Admitting: Nurse Practitioner

## 2015-06-20 ENCOUNTER — Inpatient Hospital Stay (HOSPITAL_COMMUNITY)
Admission: AD | Admit: 2015-06-20 | Discharge: 2015-06-25 | DRG: 291 | Disposition: A | Discharge status: Home / Self Care | Payer: Medicare Other | Source: Other Acute Inpatient Hospital | Attending: Internal Medicine | Admitting: Internal Medicine

## 2015-06-20 DIAGNOSIS — I272 Other secondary pulmonary hypertension: Secondary | ICD-10-CM | POA: Diagnosis present

## 2015-06-20 DIAGNOSIS — Z7982 Long term (current) use of aspirin: Secondary | ICD-10-CM

## 2015-06-20 DIAGNOSIS — M545 Low back pain: Secondary | ICD-10-CM | POA: Diagnosis present

## 2015-06-20 DIAGNOSIS — I130 Hypertensive heart and chronic kidney disease with heart failure and stage 1 through stage 4 chronic kidney disease, or unspecified chronic kidney disease: Secondary | ICD-10-CM | POA: Diagnosis present

## 2015-06-20 DIAGNOSIS — I5043 Acute on chronic combined systolic (congestive) and diastolic (congestive) heart failure: Principal | ICD-10-CM | POA: Diagnosis present

## 2015-06-20 DIAGNOSIS — E876 Hypokalemia: Secondary | ICD-10-CM | POA: Diagnosis present

## 2015-06-20 DIAGNOSIS — N179 Acute kidney failure, unspecified: Secondary | ICD-10-CM | POA: Insufficient documentation

## 2015-06-20 DIAGNOSIS — I447 Left bundle-branch block, unspecified: Secondary | ICD-10-CM | POA: Diagnosis present

## 2015-06-20 DIAGNOSIS — I472 Ventricular tachycardia: Secondary | ICD-10-CM | POA: Diagnosis present

## 2015-06-20 DIAGNOSIS — M62838 Other muscle spasm: Secondary | ICD-10-CM | POA: Diagnosis present

## 2015-06-20 DIAGNOSIS — Z7901 Long term (current) use of anticoagulants: Secondary | ICD-10-CM

## 2015-06-20 DIAGNOSIS — I071 Rheumatic tricuspid insufficiency: Secondary | ICD-10-CM | POA: Diagnosis present

## 2015-06-20 DIAGNOSIS — F419 Anxiety disorder, unspecified: Secondary | ICD-10-CM | POA: Diagnosis present

## 2015-06-20 DIAGNOSIS — F329 Major depressive disorder, single episode, unspecified: Secondary | ICD-10-CM | POA: Diagnosis present

## 2015-06-20 DIAGNOSIS — I420 Dilated cardiomyopathy: Secondary | ICD-10-CM | POA: Diagnosis present

## 2015-06-20 DIAGNOSIS — R570 Cardiogenic shock: Secondary | ICD-10-CM | POA: Insufficient documentation

## 2015-06-20 DIAGNOSIS — M109 Gout, unspecified: Secondary | ICD-10-CM | POA: Diagnosis present

## 2015-06-20 DIAGNOSIS — I5022 Chronic systolic (congestive) heart failure: Secondary | ICD-10-CM | POA: Diagnosis present

## 2015-06-20 DIAGNOSIS — N183 Chronic kidney disease, stage 3 unspecified: Secondary | ICD-10-CM | POA: Diagnosis present

## 2015-06-20 DIAGNOSIS — I340 Nonrheumatic mitral (valve) insufficiency: Secondary | ICD-10-CM | POA: Diagnosis present

## 2015-06-20 DIAGNOSIS — I2721 Secondary pulmonary arterial hypertension: Secondary | ICD-10-CM | POA: Diagnosis present

## 2015-06-20 DIAGNOSIS — E871 Hypo-osmolality and hyponatremia: Secondary | ICD-10-CM | POA: Diagnosis present

## 2015-06-20 DIAGNOSIS — I5023 Acute on chronic systolic (congestive) heart failure: Secondary | ICD-10-CM | POA: Insufficient documentation

## 2015-06-20 DIAGNOSIS — Z95810 Presence of automatic (implantable) cardiac defibrillator: Secondary | ICD-10-CM | POA: Diagnosis not present

## 2015-06-20 DIAGNOSIS — N184 Chronic kidney disease, stage 4 (severe): Secondary | ICD-10-CM | POA: Diagnosis present

## 2015-06-20 DIAGNOSIS — E872 Acidosis: Secondary | ICD-10-CM

## 2015-06-20 DIAGNOSIS — N189 Chronic kidney disease, unspecified: Secondary | ICD-10-CM

## 2015-06-20 DIAGNOSIS — R0602 Shortness of breath: Secondary | ICD-10-CM

## 2015-06-20 DIAGNOSIS — R531 Weakness: Secondary | ICD-10-CM | POA: Diagnosis present

## 2015-06-20 HISTORY — DX: Chronic systolic (congestive) heart failure: I50.22

## 2015-06-20 HISTORY — DX: Other cardiomyopathies: I42.8

## 2015-06-20 HISTORY — DX: Chronic kidney disease, stage 3 (moderate): N18.3

## 2015-06-20 HISTORY — DX: Presence of automatic (implantable) cardiac defibrillator: Z95.810

## 2015-06-20 HISTORY — DX: Chronic kidney disease, stage 3 unspecified: N18.30

## 2015-06-20 HISTORY — DX: Secondary pulmonary arterial hypertension: I27.21

## 2015-06-20 HISTORY — DX: Rheumatic tricuspid insufficiency: I07.1

## 2015-06-20 LAB — CBC
HEMATOCRIT: 32.5 % — AB (ref 36.0–46.0)
HEMOGLOBIN: 11.4 g/dL — AB (ref 12.0–15.0)
MCH: 28.8 pg (ref 26.0–34.0)
MCHC: 35.1 g/dL (ref 30.0–36.0)
MCV: 82.1 fL (ref 78.0–100.0)
Platelets: 190 10*3/uL (ref 150–400)
RBC: 3.96 MIL/uL (ref 3.87–5.11)
RDW: 17.1 % — ABNORMAL HIGH (ref 11.5–15.5)
WBC: 11.7 10*3/uL — AB (ref 4.0–10.5)

## 2015-06-20 MED ORDER — TORSEMIDE 20 MG PO TABS
40.0000 mg | ORAL_TABLET | Freq: Every day | ORAL | Status: DC
Start: 2015-06-21 — End: 2015-06-20

## 2015-06-20 MED ORDER — OXYCODONE-ACETAMINOPHEN 5-325 MG PO TABS
1.0000 | ORAL_TABLET | Freq: Three times a day (TID) | ORAL | Status: DC | PRN
  Administered 2015-06-21 – 2015-06-22 (×3): 1 via ORAL
  Filled 2015-06-20 (×3): qty 1

## 2015-06-20 MED ORDER — ISOSORB DINITRATE-HYDRALAZINE 20-37.5 MG PO TABS
1.0000 | ORAL_TABLET | Freq: Three times a day (TID) | ORAL | Status: DC
  Administered 2015-06-20 – 2015-06-25 (×14): 1 via ORAL
  Filled 2015-06-20 (×14): qty 1

## 2015-06-20 MED ORDER — ASPIRIN 81 MG PO CHEW
81.0000 mg | CHEWABLE_TABLET | Freq: Every day | ORAL | Status: DC
  Administered 2015-06-21 – 2015-06-25 (×5): 81 mg via ORAL
  Filled 2015-06-20 (×5): qty 1

## 2015-06-20 MED ORDER — SORBITOL 70 % SOLN: 30.0000 mL | Freq: Every day | Status: DC | PRN

## 2015-06-20 MED ORDER — HEPARIN SODIUM (PORCINE) 5000 UNIT/ML IJ SOLN
5000.0000 [IU] | Freq: Three times a day (TID) | INTRAMUSCULAR | Status: DC
Start: 2015-06-20 — End: 2015-06-25
  Administered 2015-06-20 – 2015-06-25 (×14): 5000 [IU] via SUBCUTANEOUS
  Filled 2015-06-20 (×14): qty 1

## 2015-06-20 MED ORDER — MORPHINE SULFATE (PF) 2 MG/ML IV SOLN: 2.0000 mg | INTRAVENOUS | Status: DC | PRN

## 2015-06-20 MED ORDER — BUMETANIDE 0.25 MG/ML IJ SOLN
1.0000 mg | Freq: Two times a day (BID) | INTRAMUSCULAR | Status: DC
  Administered 2015-06-21 (×2): 1 mg via INTRAVENOUS
  Filled 2015-06-20 (×2): qty 4

## 2015-06-20 MED ORDER — SODIUM CHLORIDE 0.9% FLUSH
3.0000 mL | Freq: Two times a day (BID) | INTRAVENOUS | Status: DC
  Administered 2015-06-20 – 2015-06-21 (×2): 3 mL via INTRAVENOUS
  Administered 2015-06-21: 10 mL via INTRAVENOUS
  Administered 2015-06-22 – 2015-06-24 (×4): 3 mL via INTRAVENOUS

## 2015-06-20 MED ORDER — ALLOPURINOL 100 MG PO TABS
200.0000 mg | ORAL_TABLET | Freq: Every day | ORAL | Status: DC
Start: 2015-06-21 — End: 2015-06-25
  Administered 2015-06-21 – 2015-06-25 (×5): 200 mg via ORAL
  Filled 2015-06-20 (×5): qty 2

## 2015-06-20 MED ORDER — METHOCARBAMOL 500 MG PO TABS
500.0000 mg | ORAL_TABLET | Freq: Three times a day (TID) | ORAL | Status: DC
  Administered 2015-06-20 – 2015-06-25 (×14): 500 mg via ORAL
  Filled 2015-06-20 (×14): qty 1

## 2015-06-20 NOTE — H&P (Signed)
- CARDIOLOGY INPATIENT HISTORY AND PHYSICAL EXAMINATION NOTE  Patient ID: Michele Caldwell MRN: [id number], DOB/AGE: 79-Jul-1936   Admit date: 06/20/2015   Primary Physician: No PCP Per Patient Primary Cardiologist: Sherryl Manges MD Heart failure: Nicholes Mango MD  Reason for admission: Generalized weakness  HPI: This is a 81 y.o.AA female with HFrEF secondary to non ischemic cardiomyopathy (LVEF <15% on 06/09/2015), LBBB s/p AICD, CKD stage IV, LHC in 2008 demonstrated no CAD. In the past patient was admitted with volume overload with poor response to lasix and ended up needing 0.25 mcg of milrinone. She diuresed 30 lbs during the course and was continued on bidil at that time. Due to CKD she was not started on ACE/ARB/spironolactone.  Patient was discharged on 2/22 after a 9 day stay and went she went home, she started feeling SOB with minimal exertion such as going to the bathroom. She is also complaining of a lot of back pain. She was doing well before leaving and cardiac rehab phase II was started. She did 210 ft before discharge. Her discharge weight was 75 kg and she is 74 kg today. Her creatinine also improved during the stay. She however has lost her appetite and has not been eating well at home. Her home health care came today. Her husband was also in hospital and she has someone staying with her every day. She is currently feeling weak, nausea, hot flashes.  She denied diarrhea, constipation, cough, fever, chills, leg swelling. During the admission, coreg, lasix and spironolactone was held. She was started on bidil 20/37.5 and torsemide 40 mg were started. Patient is feeling depressed and her granddaughter who is RN also feels that. She does not want to talk about end of life care.   Problem List: Past Medical History  Diagnosis Date  . Chronic systolic CHF (congestive heart failure) (HCC)     a. 06/2006 EF 20%; b. 04/2007 EF 15%; c. 01/2010 EF 15%; d, 05/2015 Echo: EF 20%, diff HK, inf,  infsept, post AK, sev dil LV w/ mild LVH, sev MR, sev dil LA, mildly reduced RV fxn, mildly dil RA, mod TR, PASP .  [name] NICM (nonischemic cardiomyopathy) (HCC)     a. 06/2006 Cath: nl cors;  b. 05/2007 BSX H885 Confient Single Lead AICD; c. 05/2015 Echo: EF 15-20%.  . Severe mitral regurgitation     a. 05/2015 Echo: Severe MR.  . CKD (chronic kidney disease), stage III   . Moderate tricuspid regurgitation   . PAH (pulmonary artery hypertension) (HCC)     a. 05/2015 Echo: PASP .  [name] AICD (automatic cardioverter/defibrillator) present     a. 05/2007 BSX E030 Confient Single Lead AICD.  [name] Hypertensive heart disease     Past Surgical History  Procedure Laterality Date  . Cardiac catheterization  2008    nl cors, EF 20%  . Cardiac defibrillator placement  2009    [employer] Vitality II ICD     Allergies:  Allergies  Allergen Reactions  . Ace Inhibitors Cough    Also reported angioedema     Home Medications Current Facility-Administered Medications  Medication Dose Route Frequency Provider Last Rate Last Dose  . allopurinol (ZYLOPRIM) tablet 200 mg  200 mg Oral Daily Joellyn Rued, MD      . aspirin chewable tablet 81 mg  81 mg Oral Daily Joellyn Rued, MD      . bumetanide (BUMEX) injection 1 mg  1 mg Intravenous Q12H Joellyn Rued, MD  1 mg at 06/21/15 0005  . heparin injection 5,000 Units  5,000 Units Subcutaneous 3 times per day Joellyn Rued, MD   5,000 Units at 06/20/15 2328  . isosorbide-hydrALAZINE (BIDIL) 20-37.5 MG per tablet 1 tablet  1 tablet Oral TID Joellyn Rued, MD   1 tablet at 06/20/15 2327  . methocarbamol (ROBAXIN) tablet 500 mg  500 mg Oral TID Joellyn Rued, MD   500 mg at 06/20/15 2327  . milrinone (PRIMACOR) 20 MG/100ML (0.2 mg/mL) infusion  0.25 mcg/kg/min Intravenous Continuous Joellyn Rued, MD      . morphine 2 MG/ML injection 2 mg  2 mg Intravenous Q4H PRN Joellyn Rued, MD      . oxyCODONE-acetaminophen (PERCOCET/ROXICET)  5-325 MG per tablet 1 tablet  1 tablet Oral Q8H PRN Joellyn Rued, MD      . sodium chloride flush (NS) 0.9 % injection 3 mL  3 mL Intravenous Q12H Joellyn Rued, MD   3 mL at 06/20/15 2328  . sorbitol 70 % solution 30 mL  30 mL Oral Daily PRN Joellyn Rued, MD         Family History  Problem Relation Age of Onset  . Cancer    . Heart failure    . Hypertension Mother   . Heart disease Mother   . Heart failure Mother   . Kidney disease Mother   . Cancer Father     BACK  . Heart attack Mother      Social History   Social History  . Marital Status: Single    Spouse Name: N/A  . Number of Children: N/A  . Years of Education: N/A   Occupational History  . Retired    Social History Main Topics  . Smoking status: Never Smoker   . Smokeless tobacco: Never Used  . Alcohol Use: No  . Drug Use: No  . Sexual Activity: Not on file   Other Topics Concern  . Not on file   Social History Narrative     Review of Systems: General: negative for chills, fever, night sweats or weight changes.  Cardiovascular: dyspnea on exertion and PND negative for edema, orthopnea, palpitations  Dermatological: negative for rash Respiratory: negative for cough or wheezing Urologic: negative for hematuria Abdominal: nausea negative for vomiting, diarrhea, bright red blood per rectum, melena, or hematemesis Neurologic: negative for visual changes, syncope, or dizziness Endocrine: no diabetes, no hypothyroidism Immunological: no lymph adenopathy Psych: non homicidal/suicidal  Physical Exam: Vitals: BP 90/61 mmHg  Pulse 102  Temp(Src) 98.6 F (37 C) (Oral)  Resp 21  Ht 5\' 6"  (1.676 m)  Wt 74.2 kg (163 lb 9.3 oz)  BMI 26.42 kg/m2  SpO2 97% General: not in acute distress Neck: JVP elevated, while sitting up to mid of neck  Heart: regular rate and rhythm, S1, S2, systolic grade II/VI at PMI Lungs: crackles bilaterally, decreased breath sounds on bases GI: non tender, non distended,  bowel sounds present Extremities: 1+ bilateral lower extremity edema Neuro: AAO x 3  Psych: depressed mood, anhedonia, non suicidal/non homicidal   Labs:   Results for orders placed or performed during the hospital encounter of 06/20/15 (from the past 24 hour(s))  MRSA PCR Screening     Status: None   Collection Time: 06/20/15  9:16 PM  Result Value Ref Range   MRSA by PCR NEGATIVE NEGATIVE  Brain natriuretic peptide     Status: Abnormal   Collection Time: 06/20/15 11:14  PM  Result Value Ref Range   B Natriuretic Peptide >4500.0 (H) 0.0 - 100.0 pg/mL  Troponin I (q 6hr x 3)     Status: Abnormal   Collection Time: 06/20/15 11:14 PM  Result Value Ref Range   Troponin I 0.22 (H) <0.031 ng/mL  CBC     Status: Abnormal   Collection Time: 06/20/15 11:14 PM  Result Value Ref Range   WBC 11.7 (H) 4.0 - 10.5 K/uL   RBC 3.96 3.87 - 5.11 MIL/uL   Hemoglobin 11.4 (L) 12.0 - 15.0 g/dL   HCT 16.1 (L) 09.6 - 04.5 %   MCV 82.1 78.0 - 100.0 fL   MCH 28.8 26.0 - 34.0 pg   MCHC 35.1 30.0 - 36.0 g/dL   RDW 40.9 (H) 81.1 - 91.4 %   Platelets 190 150 - 400 K/uL  Comprehensive metabolic panel     Status: Abnormal   Collection Time: 06/20/15 11:14 PM  Result Value Ref Range   Sodium 132 (L) 135 - 145 mmol/L   Potassium 3.9 3.5 - 5.1 mmol/L   Chloride 77 (L) 101 - 111 mmol/L   CO2 33 (H) 22 - 32 mmol/L   Glucose, Bld 127 (H) 65 - 99 mg/dL   BUN 78 (H) 6 - 20 mg/dL   Creatinine, Ser 7.82 (H) 0.44 - 1.00 mg/dL   Calcium 9.1 8.9 - 95.6 mg/dL   Total Protein 7.3 6.5 - 8.1 g/dL   Albumin 3.0 (L) 3.5 - 5.0 g/dL   AST 40 15 - 41 U/L   ALT 19 14 - 54 U/L   Alkaline Phosphatase 51 38 - 126 U/L   Total Bilirubin 3.9 (H) 0.3 - 1.2 mg/dL   GFR calc non Af Amer 15 (L) >60 mL/min   GFR calc Af Amer 18 (L) >60 mL/min   Anion gap 22 (H) 5 - 15  Magnesium     Status: None   Collection Time: 06/20/15 11:14 PM  Result Value Ref Range   Magnesium 2.2 1.7 - 2.4 mg/dL  Phosphorus     Status: Abnormal    Collection Time: 06/20/15 11:14 PM  Result Value Ref Range   Phosphorus 6.2 (H) 2.5 - 4.6 mg/dL  Protime-INR     Status: Abnormal   Collection Time: 06/20/15 11:14 PM  Result Value Ref Range   Prothrombin Time 17.8 (H) 11.6 - 15.2 seconds   INR 1.46 0.00 - 1.49  APTT     Status: None   Collection Time: 06/20/15 11:14 PM  Result Value Ref Range   aPTT 29 24 - 37 seconds  Lactic acid, plasma     Status: Abnormal   Collection Time: 06/20/15 11:14 PM  Result Value Ref Range   Lactic Acid, Venous 2.4 (HH) 0.5 - 2.0 mmol/L     Radiology/Studies: Dg Chest 2 View  06/10/2015  CLINICAL DATA:  CHF, cardiomyopathy, chronic renal insufficiency EXAM: CHEST  2 VIEW COMPARISON:  PA and lateral chest x-ray of May 31, 2007 FINDINGS: The lungs are well-expanded. The interstitial markings are mildly increased bilaterally. The cardiac silhouette is enlarged and more conspicuous than in the past. The central pulmonary vascularity is mildly prominent though stable. The permanent pacemaker defibrillator is in stable position. There is no pleural effusion or pneumothorax. There is multilevel degenerative disc disease of the thoracic spine. IMPRESSION: Interval development of pulmonary interstitial edema consistent with CHF. Interval increase in the size of the cardiac silhouette. No pleural effusion or alveolar pneumonia. Electronically Signed  By: David  [place] M.D.   On: 06/10/2015 08:13   Ir Fluoro Guide Cv Line Right  06/12/2015  INDICATION: Malpositioned PICC line. EXAM: PICC LINE EXCHANGE WITH FLUOROSCOPY Physician: Rachelle Hora. Henn, MD MEDICATIONS: None ANESTHESIA/SEDATION: None FLUOROSCOPY TIME:  Fluoroscopy Time: 30 seconds, 4 mGy COMPLICATIONS: None immediate. PROCEDURE: The procedure was explained to the patient. The risks and benefits of the procedure were discussed and the patient's questions were addressed. Informed consent was obtained from the patient. The existing right arm PICC line was prepped and  draped in sterile fashion. Maximal barrier sterile technique was utilized including caps, mask, sterile gowns, sterile gloves, sterile drape, hand hygiene and skin antiseptic. Fluoroscopy confirmed that the PICC line was extending into the right jugular vein. Catheter was pulled back and cut. Catheter was removed over a 0.018 wire. A peel-away sheath was placed. A dual lumen 5 French Power PICC was cut to 40 cm. Catheter was advanced through the peel-away sheath and positioned at superior cavoatrial junction. Both lumens aspirated and flushed well. Catheter was sutured to the skin with a StatLock device. Fluoroscopic images were taken and saved for this procedure. FINDINGS: Catheter tip at the superior cavoatrial junction. IMPRESSION: Successful exchange of a right arm PICC line with fluoroscopy. Electronically Signed   By: Richarda Overlie M.D.   On: 06/12/2015 15:26   Dg Chest Port 1 View  06/20/2015  CLINICAL DATA:  81 year old with chronic systolic CHF, severe mitral regurgitation, stage 3 chronic kidney disease and pulmonary arterial hypertension, presenting with acute shortness of breath. EXAM: PORTABLE CHEST 1 VIEW COMPARISON:  06/11/2015 and earlier. FINDINGS: Cardiac silhouette massively enlarged, unchanged. Left subclavian pacing defibrillator incompletely imaged. Pulmonary venous hypertension and mild diffuse interstitial and airspace pulmonary edema, more so than on the most recent prior examination. Small bilateral pleural effusions. IMPRESSION: Mild CHF, with stable massive cardiomegaly, mild diffuse interstitial and airspace pulmonary edema and small bilateral pleural effusions. Electronically Signed   By: Hulan Saas M.D.   On: 06/20/2015 23:28   Dg Chest Port 1 View  06/11/2015  CLINICAL DATA:  81 year old female status post reposition of right PICC EXAM: PORTABLE CHEST 1 VIEW COMPARISON:  Prior chest x-ray earlier today 06/11/2015 at 19:19 p.m. FINDINGS: Unchanged position of right upper  extremity approach PICC which deviates upward into the internal jugular vein. The tip of the catheter is not identified as it lies above the field of view. Otherwise, stable appearance of the chest with marked enlargement of the cardiopericardial silhouette, aortic atherosclerosis, pulmonary edema and a left subclavian approach cardiac rhythm maintenance device. IMPRESSION: Unchanged position of the right upper extremity PICC which likely terminates in the right internal jugular vein. Electronically Signed   By: Malachy Moan M.D.   On: 06/11/2015 22:59   Dg Chest Port 1 View  06/11/2015  CLINICAL DATA:  81 year old female status post right PICC placement. EXAM: PORTABLE CHEST 1 VIEW COMPARISON:  Plain radiograph dated 06/10/2015 FINDINGS: Right-sided PICC extends into the right internal jugular vein. Recommend retraction and repositioning. The tip of the central line is beyond the superior image margin. Single portable view of the chest does not demonstrate focal consolidation. There is no pleural effusion or pneumothorax. Stable severe cardiomegaly. Left pectoral AICD device. IMPRESSION: Right-sided PICC with tip extending into the right IJ. Recommend retraction and repositioning. These results were called by telephone at the time of interpretation on 06/11/2015 at 7:34 pm to nurse Rasul, who verbally acknowledged these results. Electronically Signed   By: Burtis Junes  Radparvar M.D.   On: 06/11/2015 19:37    EKG: normal sinus rhythm, LBBB, non specific ST/T wave changes  Echo: 06/10/2015  LVEF 15-20%, mild LVH, severely dilated LV, PA pressures of 55, RA mildly dilated, LA severly dilated,  Severe MR present  Cardiac cath: 2008 -ve for CAD  Medical decision making:  Discussed care with the patient Discussed care with the physician on the phone Reviewed labs and imaging personally Reviewed prior records  ASSESSMENT AND PLAN:  This is a  81 y.o.AA female with HFrEF secondary to non ischemic  cardiomyopathy (LVEF <15% on 06/09/2015), LBBB s/p AICD, CKD stage IV, LHC in 2008 demonstrated no CAD who presented with volume overload.    Active Problems:   PAH (pulmonary artery hypertension) (HCC)   Moderate tricuspid regurgitation   CKD (chronic kidney disease), stage III   Chronic systolic CHF (congestive heart failure) (HCC)   AICD (automatic cardioverter/defibrillator) present  Acute on chronic systolic and diastolic congestive heart failure Mildly volume overloaded, elevated PA pressures, dilated cardiomyopathy, however the weight has decreased but JVP is elevated, poor appetite Poor response to IV lasix at outside hospital - will give 1 mg bumex BID, if poor response, consider starting back on milrinone - BMP BID, daily weights, strict I/Os, low sodium diet - does not want to discuss end of life care.  - checking lactic acid, if elevated, will start on milrinone. Will eventually probably need PICC line  Situational anxiety/depression Counseled the patient Consider SSRI/mirtazapine if patient continues to have anhedonia and poor appetite  Lower back pain with muscle spasm Giving robaxin and percocet to control pain   Severe MR Avoid increase in afterload, continue bidil for now Consider starting on ivabridine to improve CO  Signed, Joellyn Rued, MD MS 06/21/2015, 12:52 AM

## 2015-06-21 DIAGNOSIS — I5023 Acute on chronic systolic (congestive) heart failure: Secondary | ICD-10-CM | POA: Insufficient documentation

## 2015-06-21 DIAGNOSIS — N179 Acute kidney failure, unspecified: Secondary | ICD-10-CM

## 2015-06-21 DIAGNOSIS — E876 Hypokalemia: Secondary | ICD-10-CM | POA: Insufficient documentation

## 2015-06-21 DIAGNOSIS — N189 Chronic kidney disease, unspecified: Secondary | ICD-10-CM

## 2015-06-21 DIAGNOSIS — R570 Cardiogenic shock: Secondary | ICD-10-CM | POA: Insufficient documentation

## 2015-06-21 LAB — MRSA PCR SCREENING: MRSA BY PCR: NEGATIVE

## 2015-06-21 LAB — COMPREHENSIVE METABOLIC PANEL
ALBUMIN: 3 g/dL — AB (ref 3.5–5.0)
ALK PHOS: 51 U/L (ref 38–126)
ALT: 19 U/L (ref 14–54)
ANION GAP: 22 — AB (ref 5–15)
AST: 40 U/L (ref 15–41)
BUN: 78 mg/dL — ABNORMAL HIGH (ref 6–20)
CALCIUM: 9.1 mg/dL (ref 8.9–10.3)
CO2: 33 mmol/L — ABNORMAL HIGH (ref 22–32)
Chloride: 77 mmol/L — ABNORMAL LOW (ref 101–111)
Creatinine, Ser: 2.78 mg/dL — ABNORMAL HIGH (ref 0.44–1.00)
GFR calc non Af Amer: 15 mL/min — ABNORMAL LOW (ref >60)
GFR, EST AFRICAN AMERICAN: 18 mL/min — AB (ref >60)
Glucose, Bld: 127 mg/dL — ABNORMAL HIGH (ref 65–99)
POTASSIUM: 3.9 mmol/L (ref 3.5–5.1)
SODIUM: 132 mmol/L — AB (ref 135–145)
Total Bilirubin: 3.9 mg/dL — ABNORMAL HIGH (ref 0.3–1.2)
Total Protein: 7.3 g/dL (ref 6.5–8.1)

## 2015-06-21 LAB — BASIC METABOLIC PANEL
Anion gap: 20 — ABNORMAL HIGH (ref 5–15)
Anion gap: 16 — ABNORMAL HIGH (ref 5–15)
BUN: 81 mg/dL — AB (ref 6–20)
BUN: 83 mg/dL — AB (ref 6–20)
CALCIUM: 8.8 mg/dL — AB (ref 8.9–10.3)
CALCIUM: 8.9 mg/dL (ref 8.9–10.3)
CHLORIDE: 77 mmol/L — AB (ref 101–111)
CHLORIDE: 79 mmol/L — AB (ref 101–111)
CO2: 33 mmol/L — ABNORMAL HIGH (ref 22–32)
CO2: 36 mmol/L — AB (ref 22–32)
CREATININE: 2.72 mg/dL — AB (ref 0.44–1.00)
CREATININE: 2.67 mg/dL — AB (ref 0.44–1.00)
GFR calc Af Amer: 18 mL/min — ABNORMAL LOW (ref >60)
GFR calc non Af Amer: 16 mL/min — ABNORMAL LOW (ref >60)
GFR calc non Af Amer: 16 mL/min — ABNORMAL LOW (ref >60)
GFR, EST AFRICAN AMERICAN: 19 mL/min — AB (ref >60)
Glucose, Bld: 106 mg/dL — ABNORMAL HIGH (ref 65–99)
Glucose, Bld: 138 mg/dL — ABNORMAL HIGH (ref 65–99)
Potassium: 3.3 mmol/L — ABNORMAL LOW (ref 3.5–5.1)
Potassium: 3.9 mmol/L (ref 3.5–5.1)
SODIUM: 131 mmol/L — AB (ref 135–145)
Sodium: 130 mmol/L — ABNORMAL LOW (ref 135–145)

## 2015-06-21 LAB — URINALYSIS, ROUTINE W REFLEX MICROSCOPIC
Glucose, UA: NEGATIVE mg/dL
KETONES UR: NEGATIVE mg/dL
NITRITE: NEGATIVE
PH: 5.5 (ref 5.0–8.0)
Protein, ur: 30 mg/dL — AB
SPECIFIC GRAVITY, URINE: 1.016 (ref 1.005–1.030)

## 2015-06-21 LAB — LACTIC ACID, PLASMA
LACTIC ACID, VENOUS: 2.4 mmol/L — AB (ref 0.5–2.0)
LACTIC ACID, VENOUS: 1.5 mmol/L (ref 0.5–2.0)

## 2015-06-21 LAB — APTT: aPTT: 29 seconds (ref 24–37)

## 2015-06-21 LAB — MAGNESIUM: Magnesium: 2.2 mg/dL (ref 1.7–2.4)

## 2015-06-21 LAB — TROPONIN I
TROPONIN I: 0.22 ng/mL — AB (ref <0.031)
TROPONIN I: 0.26 ng/mL — AB (ref <0.031)
TROPONIN I: 0.23 ng/mL — AB (ref <0.031)

## 2015-06-21 LAB — PROTIME-INR
INR: 1.46 (ref 0.00–1.49)
PROTHROMBIN TIME: 17.8 s — AB (ref 11.6–15.2)

## 2015-06-21 LAB — BRAIN NATRIURETIC PEPTIDE

## 2015-06-21 LAB — URINE MICROSCOPIC-ADD ON

## 2015-06-21 LAB — PHOSPHORUS: PHOSPHORUS: 6.2 mg/dL — AB (ref 2.5–4.6)

## 2015-06-21 LAB — GLUCOSE, CAPILLARY: Glucose-Capillary: 102 mg/dL — ABNORMAL HIGH (ref 65–99)

## 2015-06-21 MED ORDER — POTASSIUM CHLORIDE ER 10 MEQ PO TBCR
20.0000 meq | EXTENDED_RELEASE_TABLET | Freq: Two times a day (BID) | ORAL | Status: DC
  Administered 2015-06-21 – 2015-06-23 (×6): 20 meq via ORAL
  Filled 2015-06-21 (×13): qty 2

## 2015-06-21 MED ORDER — SODIUM CHLORIDE 0.9 % IV SOLN
INTRAVENOUS | Status: DC
  Administered 2015-06-21 – 2015-06-22 (×2): via INTRAVENOUS

## 2015-06-21 MED ORDER — AMIODARONE LOAD VIA INFUSION
150.0000 mg | Freq: Once | INTRAVENOUS | Status: AC
  Administered 2015-06-21: 150 mg via INTRAVENOUS
  Filled 2015-06-21: qty 83.34

## 2015-06-21 MED ORDER — FUROSEMIDE 10 MG/ML IJ SOLN
80.0000 mg | Freq: Two times a day (BID) | INTRAMUSCULAR | Status: DC
  Administered 2015-06-21 – 2015-06-23 (×5): 80 mg via INTRAVENOUS
  Filled 2015-06-21 (×5): qty 8

## 2015-06-21 MED ORDER — SODIUM CHLORIDE 0.9% FLUSH
10.0000 mL | Freq: Two times a day (BID) | INTRAVENOUS | Status: DC
  Administered 2015-06-21: 10 mL
  Administered 2015-06-22: 20 mL
  Administered 2015-06-23: 10 mL
  Administered 2015-06-24: 20 mL
  Administered 2015-06-24: 10 mL

## 2015-06-21 MED ORDER — MILRINONE IN DEXTROSE 20 MG/100ML IV SOLN
0.2500 ug/kg/min | INTRAVENOUS | Status: DC
  Administered 2015-06-21 – 2015-06-24 (×7): 0.25 ug/kg/min via INTRAVENOUS
  Filled 2015-06-21 (×7): qty 100

## 2015-06-21 MED ORDER — CETYLPYRIDINIUM CHLORIDE 0.05 % MT LIQD
7.0000 mL | Freq: Two times a day (BID) | OROMUCOSAL | Status: DC
  Administered 2015-06-22 – 2015-06-25 (×6): 7 mL via OROMUCOSAL

## 2015-06-21 MED ORDER — METOLAZONE 5 MG PO TABS
5.0000 mg | ORAL_TABLET | Freq: Every day | ORAL | Status: DC
  Administered 2015-06-21 – 2015-06-23 (×3): 5 mg via ORAL
  Filled 2015-06-21 (×3): qty 1

## 2015-06-21 MED ORDER — SODIUM CHLORIDE 0.9% FLUSH
10.0000 mL | INTRAVENOUS | Status: DC | PRN
Start: 2015-06-21 — End: 2015-06-25

## 2015-06-21 MED ORDER — POTASSIUM CHLORIDE CRYS ER 20 MEQ PO TBCR
40.0000 meq | EXTENDED_RELEASE_TABLET | Freq: Once | ORAL | Status: AC
  Administered 2015-06-21: 40 meq via ORAL
  Filled 2015-06-21: qty 2

## 2015-06-21 MED ORDER — AMIODARONE HCL IN DEXTROSE 360-4.14 MG/200ML-% IV SOLN
30.0000 mg/h | INTRAVENOUS | Status: DC
  Administered 2015-06-22: 30 mg/h via INTRAVENOUS
  Filled 2015-06-21: qty 200

## 2015-06-21 MED ORDER — AMIODARONE HCL IN DEXTROSE 360-4.14 MG/200ML-% IV SOLN
60.0000 mg/h | INTRAVENOUS | Status: DC
  Administered 2015-06-21: 60 mg/h via INTRAVENOUS
  Filled 2015-06-21 (×2): qty 200

## 2015-06-21 NOTE — Progress Notes (Signed)
CRITICAL VALUE ALERT  Critical value received:  Lactic acid 2.4  Date of notification:  00:43   Time of notification:  06/21/15  Critical value read back:Yes.    Nurse who received alert:  Hillery Jacks, RN  MD notified (1st page):  Dr. Virgina Organ  Time of first page:  00:44  Notified via text page no new orders at this time.

## 2015-06-21 NOTE — Progress Notes (Addendum)
Initial Nutrition Assessment  DOCUMENTATION CODES:   Not applicable  INTERVENTION:    Continue Dysphagia 3/Low Sodium (2 gm) diet  NUTRITION DIAGNOSIS:   Inadequate oral intake related to poor appetite (depression) as evidenced by meal completion < 50%  GOAL:   Patient will meet greater than or equal to 90% of their needs  MONITOR:   PO intake, Labs, Weight trends, Skin, I & O's  REASON FOR ASSESSMENT:   Malnutrition Screening Tool  ASSESSMENT:   81 y.o.AA Female with HFrEF secondary to non ischemic cardiomyopathy (LVEF <15% on 06/09/2015), LBBB s/p AICD, CKD stage IV, LHC in 2008 demonstrated no CAD.  Patient not very conversant with RD during interview; watching TV. Per RN, pt is depressed.  Missing her upper teeth. PO intake vairable at 10-25% per flowsheet records. Would benefit from addition of oral nutrition supplements, however, pt declined. Will add Dysphagia 3 modifier current diet order.  No muscle or subcutaneous fat depletion noticed.  Diet Order:  Diet 2 gram sodium Room service appropriate?: Yes; Fluid consistency:: Thin  Skin:  Reviewed, no issues  Last BM:  2/24  Height:   Ht Readings from Last 1 Encounters:  06/20/15 5\' 6"  (1.676 m)    Weight:   Wt Readings from Last 1 Encounters:  06/20/15 163 lb 9.3 oz (74.2 kg)    Ideal Body Weight:  59 kg  BMI:  Body mass index is 26.42 kg/(m^2).  Estimated Nutritional Needs:   Kcal:  1700-1900  Protein:  80-90 gm  Fluid:  1.7-1.9 L  EDUCATION NEEDS:   No education needs identified at this time  Maureen Chatters, RD, LDN Pager #: [phone] After-Hours Pager #: [phone]

## 2015-06-21 NOTE — Progress Notes (Addendum)
Patient well known to me from recent admission with low output and cardiogenic shock requiring inotropic support. We were able to wean milrinone in house and she was discharged on 2/22.   Unfortunately presented last night with recurrent low output HF/shock with elevated lactate and worsening renal failure.    On exam her neck veins are up to jaw and she has very prominent s3 and sinus tachycardia.   I discussed the situation with her and said the only options are to consider home inotropes versus palliative care/hospice.   She would like to pursue home inotropes.  Will place PICC. Continue milrinone and IV diuresis. No b-blocker or ACE with renal failure and low output.   The patient is critically ill with multiple organ systems failure and requires high complexity decision making for assessment and support, frequent evaluation and titration of therapies, application of advanced monitoring technologies and extensive interpretation of multiple databases.   Critical Care Time devoted to patient care services described in this note is 35 Minutes.   Rella Egelston,MD 12:04 PM

## 2015-06-21 NOTE — Progress Notes (Signed)
Peripherally Inserted Central Catheter/Midline Placement  The IV Nurse has discussed with the patient and/or persons authorized to consent for the patient, the purpose of this procedure and the potential benefits and risks involved with this procedure.  The benefits include less needle sticks, lab draws from the catheter and patient may be discharged home with the catheter.  Risks include, but not limited to, infection, bleeding, blood clot (thrombus formation), and puncture of an artery; nerve damage and irregular heat beat.  Alternatives to this procedure were also discussed.  PICC/Midline Placement Documentation  PICC Double Lumen 06/21/15 PICC Right Basilic 36 cm 0 cm (Active)  Indication for Insertion or Continuance of Line Vasoactive infusions 06/21/2015  4:17 PM  Exposed Catheter (cm) 0 cm 06/21/2015  4:17 PM  Site Assessment Clean;Dry;Intact 06/21/2015  4:17 PM  Lumen #1 Status Flushed;Saline locked;Blood return noted 06/21/2015  4:17 PM  Lumen #2 Status Flushed;Saline locked;Blood return noted 06/21/2015  4:17 PM  Dressing Type Transparent 06/21/2015  4:17 PM  Dressing Status Clean;Dry;Intact 06/21/2015  4:17 PM  Dressing Change Due 06/28/15 06/21/2015  4:17 PM       [name] 06/21/2015, 4:19 PM

## 2015-06-22 LAB — GLUCOSE, CAPILLARY: GLUCOSE-CAPILLARY: 108 mg/dL — AB (ref 65–99)

## 2015-06-22 LAB — CARBOXYHEMOGLOBIN
Carboxyhemoglobin: 1.9 % — ABNORMAL HIGH (ref 0.5–1.5)
METHEMOGLOBIN: 0.8 % (ref 0.0–1.5)
O2 Saturation: 66.5 %
TOTAL HEMOGLOBIN: 10.5 g/dL — AB (ref 12.0–16.0)

## 2015-06-22 LAB — BASIC METABOLIC PANEL
ANION GAP: 15 (ref 5–15)
ANION GAP: 17 — AB (ref 5–15)
BUN: 80 mg/dL — ABNORMAL HIGH (ref 6–20)
BUN: 76 mg/dL — ABNORMAL HIGH (ref 6–20)
CALCIUM: 9.1 mg/dL (ref 8.9–10.3)
CALCIUM: 9.3 mg/dL (ref 8.9–10.3)
CHLORIDE: 78 mmol/L — AB (ref 101–111)
CO2: 40 mmol/L — AB (ref 22–32)
CO2: 36 mmol/L — AB (ref 22–32)
Chloride: 76 mmol/L — ABNORMAL LOW (ref 101–111)
Creatinine, Ser: 2.48 mg/dL — ABNORMAL HIGH (ref 0.44–1.00)
Creatinine, Ser: 2.3 mg/dL — ABNORMAL HIGH (ref 0.44–1.00)
GFR calc non Af Amer: 18 mL/min — ABNORMAL LOW (ref >60)
GFR calc non Af Amer: 19 mL/min — ABNORMAL LOW (ref >60)
GFR, EST AFRICAN AMERICAN: 20 mL/min — AB (ref >60)
GFR, EST AFRICAN AMERICAN: 22 mL/min — AB (ref >60)
Glucose, Bld: 124 mg/dL — ABNORMAL HIGH (ref 65–99)
Glucose, Bld: 128 mg/dL — ABNORMAL HIGH (ref 65–99)
Potassium: 3.6 mmol/L (ref 3.5–5.1)
Potassium: 4 mmol/L (ref 3.5–5.1)
Sodium: 133 mmol/L — ABNORMAL LOW (ref 135–145)
Sodium: 129 mmol/L — ABNORMAL LOW (ref 135–145)

## 2015-06-22 MED ORDER — AMIODARONE HCL 200 MG PO TABS
200.0000 mg | ORAL_TABLET | Freq: Two times a day (BID) | ORAL | Status: DC
  Administered 2015-06-22 – 2015-06-25 (×7): 200 mg via ORAL
  Filled 2015-06-22 (×7): qty 1

## 2015-06-22 MED ORDER — SPIRONOLACTONE 25 MG PO TABS
12.5000 mg | ORAL_TABLET | Freq: Every day | ORAL | Status: DC
  Administered 2015-06-22 – 2015-06-25 (×4): 12.5 mg via ORAL
  Filled 2015-06-22 (×4): qty 1

## 2015-06-22 MED ORDER — POTASSIUM CHLORIDE ER 10 MEQ PO TBCR
20.0000 meq | EXTENDED_RELEASE_TABLET | Freq: Every day | ORAL | Status: DC
  Filled 2015-06-22: qty 2

## 2015-06-22 MED ORDER — POTASSIUM CHLORIDE CRYS ER 20 MEQ PO TBCR
40.0000 meq | EXTENDED_RELEASE_TABLET | Freq: Once | ORAL | Status: AC
  Administered 2015-06-22: 40 meq via ORAL
  Filled 2015-06-22: qty 2

## 2015-06-22 NOTE — Progress Notes (Signed)
Advanced Heart Failure Rounding Note   Subjective:    Much improved with milrinone. Diuresing well. Breathing better.   Amio started yesterday for NSVT.   PICC now in.    Objective:   Weight Range:  Vital Signs:   Temp:  [97.4 F (36.3 C)-98.9 F (37.2 C)] 97.8 F (36.6 C) (02/26 0805) Pulse Rate:  [88-109] 97 (02/26 0700) Resp:  [17-43] 22 (02/26 1100) BP: (85-109)/(37-87) 91/46 mmHg (02/26 1100) SpO2:  [94 %-99 %] 95 % (02/26 1100) Weight:  [74.2 kg (163 lb 9.3 oz)] 74.2 kg (163 lb 9.3 oz) (02/26 0423) Last BM Date: 06/20/15  Weight change: Filed Weights   06/20/15 2120 06/22/15 0423  Weight: 74.2 kg (163 lb 9.3 oz) 74.2 kg (163 lb 9.3 oz)    Intake/Output:   Intake/Output Summary (Last 24 hours) at 06/22/15 1124 Last data filed at 06/22/15 1100  Gross per 24 hour  Intake 1089.75 ml  Output   2450 ml  Net -1360.25 ml     Physical Exam: General:  Sitting up in bed. No resp difficulty HEENT: normal Neck: supple. JVP jaw . Carotids 2+ bilat; no bruits. No lymphadenopathy or thryomegaly appreciated. Cor: PMI laterally displaced. Tachy regular + loud s3 Lungs: clear Abdomen: soft, nontender, nondistended. No hepatosplenomegaly. No bruits or masses. Good bowel sounds. Extremities: no cyanosis, clubbing, rash, 1-2+ edema Neuro: alert & orientedx3, cranial nerves grossly intact. moves all 4 extremities w/o difficulty. Affect pleasant  Telemetry: SR/ST 90-100  Labs: Basic Metabolic Panel:  Recent Labs Lab 06/18/15 0550 06/20/15 2314 06/21/15 0756 06/21/15 1810 06/22/15 0430  NA 134* 132* 130* 131* 133*  K 3.7 3.9 3.3* 3.9 3.6  CL 80* 77* 77* 79* 78*  CO2 43* 33* 33* 36* 40*  GLUCOSE 116* 127* 106* 138* 124*  BUN 63* 78* 81* 83* 80*  CREATININE 1.69* 2.78* 2.72* 2.67* 2.48*  CALCIUM 8.9 9.1 8.8* 8.9 9.1  MG  --  2.2  --   --   --   PHOS  --  6.2*  --   --   --     Liver Function Tests:  Recent Labs Lab 06/20/15 2314  AST 40  ALT 19    ALKPHOS 51  BILITOT 3.9*  PROT 7.3  ALBUMIN 3.0*   No results for input(s): LIPASE, AMYLASE in the last 168 hours. No results for input(s): AMMONIA in the last 168 hours.  CBC:  Recent Labs Lab 06/16/15 0200 06/20/15 2314  WBC 11.8* 11.7*  HGB 11.0* 11.4*  HCT 32.9* 32.5*  MCV 82.5 82.1  PLT 177 190    Cardiac Enzymes:  Recent Labs Lab 06/20/15 2314 06/21/15 0604 06/21/15 0756  TROPONINI 0.22* 0.26* 0.23*    BNP: BNP (last 3 results)  Recent Labs  06/09/15 1920 06/20/15 2314  BNP >4500.0* >4500.0*    ProBNP (last 3 results) No results for input(s): PROBNP in the last 8760 hours.    Other results:  Imaging: Dg Chest Port 1 View  06/20/2015  CLINICAL DATA:  81 year old with chronic systolic CHF, severe mitral regurgitation, stage 3 chronic kidney disease and pulmonary arterial hypertension, presenting with acute shortness of breath. EXAM: PORTABLE CHEST 1 VIEW COMPARISON:  06/11/2015 and earlier. FINDINGS: Cardiac silhouette massively enlarged, unchanged. Left subclavian pacing defibrillator incompletely imaged. Pulmonary venous hypertension and mild diffuse interstitial and airspace pulmonary edema, more so than on the most recent prior examination. Small bilateral pleural effusions. IMPRESSION: Mild CHF, with stable massive cardiomegaly, mild diffuse interstitial  and airspace pulmonary edema and small bilateral pleural effusions. Electronically Signed   By: Hulan Saas M.D.   On: 06/20/2015 23:28      Medications:     Scheduled Medications: . allopurinol  200 mg Oral Daily  . antiseptic oral rinse  7 mL Mouth Rinse BID  . aspirin  81 mg Oral Daily  . furosemide  80 mg Intravenous BID  . heparin  5,000 Units Subcutaneous 3 times per day  . isosorbide-hydrALAZINE  1 tablet Oral TID  . methocarbamol  500 mg Oral TID  . metolazone  5 mg Oral Daily  . potassium chloride  20 mEq Oral BID  . sodium chloride flush  10-40 mL Intracatheter Q12H  .  sodium chloride flush  3 mL Intravenous Q12H     Infusions: . sodium chloride Stopped (06/21/15 1600)  . amiodarone 30 mg/hr (06/22/15 0800)  . milrinone 0.25 mcg/kg/min (06/22/15 0800)     PRN Medications:  morphine injection, oxyCODONE-acetaminophen, sodium chloride flush, sorbitol   Assessment:   1. Acute on chronic systolic CHF 2/2 NICM 2. Cardiogenic shock 3. AKI on CKD III (baseline cr 1.6-1.7) 4. Severe MR 5. LBBB - 6. Gout 7. Hyponatremia/hypokalemia  Plan/Discussion:    She is much improved with milrinone. Still with volume on board. Will continue IV lasix. PICC now in so will follow CVP and co-ox. Change amio to po. Supp K+.enal function improving with hemodynamic support.   She will need home milrinone. Given wide LBBB will need to consider possible CRT down the road to see if it will help stabilize her and possibly permit wean of inotropes.   Can got to SDU.  Length of Stay: 2   Michele Caldwell 06/22/2015, 11:24 AM  Advanced Heart Failure Team Pager [phone] (M-F; 7a - 4p)  Please contact [facility] for night-coverage after hours (4p -7a ) and weekends on [email or web address]

## 2015-06-22 NOTE — Progress Notes (Signed)
Amiodarone Drug - Drug Interaction Consult Note  Recommendations: -monitor patient closely for hypokalemia due to diuretics  Amiodarone is metabolized by the cytochrome P450 system and therefore has the potential to cause many drug interactions. Amiodarone has an average plasma half-life of 50 days (range 20 to 100 days).   There is potential for drug interactions to occur several weeks or months after stopping treatment and the onset of drug interactions may be slow after initiating amiodarone.    [x] Diuretics: increased risk of cardiotoxicity if hypokalemia occurs.   Thank You,   Arcola Jansky, PharmD Clinical Pharmacy Resident Pager: [phone] 06/22/2015 10:55 AM

## 2015-06-23 ENCOUNTER — Encounter (HOSPITAL_COMMUNITY): Payer: Self-pay | Admitting: *Deleted

## 2015-06-23 LAB — BASIC METABOLIC PANEL
ANION GAP: 14 (ref 5–15)
BUN: 74 mg/dL — ABNORMAL HIGH (ref 6–20)
CALCIUM: 9.1 mg/dL (ref 8.9–10.3)
CO2: 38 mmol/L — AB (ref 22–32)
Chloride: 77 mmol/L — ABNORMAL LOW (ref 101–111)
Creatinine, Ser: 2.23 mg/dL — ABNORMAL HIGH (ref 0.44–1.00)
GFR, EST AFRICAN AMERICAN: 23 mL/min — AB (ref >60)
GFR, EST NON AFRICAN AMERICAN: 20 mL/min — AB (ref >60)
Glucose, Bld: 113 mg/dL — ABNORMAL HIGH (ref 65–99)
Potassium: 4 mmol/L (ref 3.5–5.1)
Sodium: 129 mmol/L — ABNORMAL LOW (ref 135–145)

## 2015-06-23 LAB — GLUCOSE, CAPILLARY: Glucose-Capillary: 137 mg/dL — ABNORMAL HIGH (ref 65–99)

## 2015-06-23 MED ORDER — PREDNISONE 20 MG PO TABS
40.0000 mg | ORAL_TABLET | Freq: Every day | ORAL | Status: AC
  Administered 2015-06-23 – 2015-06-25 (×3): 40 mg via ORAL
  Filled 2015-06-23 (×3): qty 2

## 2015-06-23 NOTE — Care Management Important Message (Signed)
Important Message  Patient Details  Name: Michele Caldwell MRN: [id number] Date of Birth: Sep 01, 1934   Medicare Important Message Given:  Yes    Oralia Rud Sharlee Rufino 06/23/2015, 4:32 PM

## 2015-06-23 NOTE — Care Management Note (Signed)
Case Management Note  Patient Details  Name: Urania Pearlman MRN: [id number] Date of Birth: 1934-07-05  Subjective/Objective:                    Action/Plan: Met with patient's daughter to discuss discharge planning. Patient will be discharging home on home Milrinone.  She had previously established care with [facility].  Pam with [facility] was notified of need for home Milrinone. Pam to work with Janeece Riggers to provide  Medication and services at discharge.  Daughter is aware that Pam will be by to meet with them this afternoon.  Expected Discharge Date:                  Expected Discharge Plan:  [place]  In-House Referral:     Discharge planning Services  CM Consult  Post Acute Care Choice:    Choice offered to:  Adult Children  DME Arranged:    DME Agency:     HH Arranged:  RN (IV Milrinone) [place] Agency:  [place]  Status of Service:  In process, will continue to follow  Medicare Important Message Given:    Date Medicare IM Given:    Medicare IM give by:    Date Additional Medicare IM Given:    Additional Medicare Important Message give by:     If discussed at [place] of Stay Meetings, dates discussed:    Additional CommentsRolm Baptise, RN 06/23/2015, 2:18 PM [phone]

## 2015-06-23 NOTE — Progress Notes (Signed)
Advanced Heart Failure Rounding Note   Subjective:    Feels much better on milrinone. Disappointed to be back in Hospital. Understand she will need to go home on milrinone. Denies SOB or CP. No lightheadedness or dizziness. BP soft 90-100s.  Amio started 06/21/15 for NSVT.   Coox 66%. Creatinine trending down, though slowly. K stable.  Out ~2 L x 24 hours, Weight shows down 8 lbs from admission. CVP remains high at 10-11.    Objective:   Weight Range:  Vital Signs:   Temp:  [97.3 F (36.3 C)-98.4 F (36.9 C)] 98.1 F (36.7 C) (02/27 0400) Resp:  [17-33] 18 (02/27 0700) BP: (91-111)/(42-62) 94/51 mmHg (02/27 0700) SpO2:  [88 %-98 %] 98 % (02/27 0700) Weight:  [155 lb 3.3 oz (70.4 kg)] 155 lb 3.3 oz (70.4 kg) (02/27 0400) Last BM Date: 06/20/15  Weight change: Filed Weights   06/20/15 2120 06/22/15 0423 06/23/15 0400  Weight: 163 lb 9.3 oz (74.2 kg) 163 lb 9.3 oz (74.2 kg) 155 lb 3.3 oz (70.4 kg)    Intake/Output:   Intake/Output Summary (Last 24 hours) at 06/23/15 0727 Last data filed at 06/23/15 0700  Gross per 24 hour  Intake 1721.9 ml  Output   3075 ml  Net -1353.1 ml     Physical Exam: CVP 10-11 General:  Sitting up in bed. No resp difficulty HEENT: normal Neck: supple. JVP elevated to jaw . Carotids 2+ bilat; no bruits. No thyromegaly or nodule noted.  Cor: PMI laterally displaced. Tachy regular + loud s3 Lungs: Mild basilar crackles. Abdomen: soft, NT, ND, no HSM. No bruits or masses. +BS  Extremities: no cyanosis, clubbing, rash, trace to 1+ edema Neuro: alert & orientedx3, cranial nerves grossly intact. moves all 4 extremities w/o difficulty. Affect pleasant  Telemetry: SR/ST 100-110s  Labs: Basic Metabolic Panel:  Recent Labs Lab 06/20/15 2314 06/21/15 0756 06/21/15 1810 06/22/15 0430 06/22/15 1830 06/23/15 0415  NA 132* 130* 131* 133* 129* 129*  K 3.9 3.3* 3.9 3.6 4.0 4.0  CL 77* 77* 79* 78* 76* 77*  CO2 33* 33* 36* 40* 36* 38*    GLUCOSE 127* 106* 138* 124* 128* 113*  BUN 78* 81* 83* 80* 76* 74*  CREATININE 2.78* 2.72* 2.67* 2.48* 2.30* 2.23*  CALCIUM 9.1 8.8* 8.9 9.1 9.3 9.1  MG 2.2  --   --   --   --   --   PHOS 6.2*  --   --   --   --   --     Liver Function Tests:  Recent Labs Lab 06/20/15 2314  AST 40  ALT 19  ALKPHOS 51  BILITOT 3.9*  PROT 7.3  ALBUMIN 3.0*   No results for input(s): LIPASE, AMYLASE in the last 168 hours. No results for input(s): AMMONIA in the last 168 hours.  CBC:  Recent Labs Lab 06/20/15 2314  WBC 11.7*  HGB 11.4*  HCT 32.5*  MCV 82.1  PLT 190    Cardiac Enzymes:  Recent Labs Lab 06/20/15 2314 06/21/15 0604 06/21/15 0756  TROPONINI 0.22* 0.26* 0.23*    BNP: BNP (last 3 results)  Recent Labs  06/09/15 1920 06/20/15 2314  BNP >4500.0* >4500.0*    ProBNP (last 3 results) No results for input(s): PROBNP in the last 8760 hours.    Other results:  Imaging: No results found.   Medications:     Scheduled Medications: . allopurinol  200 mg Oral Daily  . amiodarone  200 mg  Oral BID  . antiseptic oral rinse  7 mL Mouth Rinse BID  . aspirin  81 mg Oral Daily  . furosemide  80 mg Intravenous BID  . heparin  5,000 Units Subcutaneous 3 times per day  . isosorbide-hydrALAZINE  1 tablet Oral TID  . methocarbamol  500 mg Oral TID  . metolazone  5 mg Oral Daily  . potassium chloride  20 mEq Oral BID  . potassium chloride  20 mEq Oral Daily  . sodium chloride flush  10-40 mL Intracatheter Q12H  . sodium chloride flush  3 mL Intravenous Q12H  . spironolactone  12.5 mg Oral Daily    Infusions: . sodium chloride 10 mL/hr at 06/22/15 1836  . milrinone 0.25 mcg/kg/min (06/22/15 1513)    PRN Medications: morphine injection, oxyCODONE-acetaminophen, sodium chloride flush, sorbitol   Assessment:   1. Acute on chronic systolic CHF 2/2 NICM 2. Cardiogenic shock 3. AKI on CKD III (baseline cr 1.6-1.7) 4. Severe MR 5. LBBB - 6. Gout 7.  Hyponatremia/hypokalemia  Plan/Discussion:    She is much improved with milrinone. Still with volume on board. Will continue IV lasix 80 mg BID for today at least opne more day. Continue po amio.    Supp K+ as needed for goal > 4.0. Renal function improving with hemodynamic support.   She will need home milrinone. Coox 66.5% this am on 0.25 mcg/kg/min.   Given wide LBBB will need to consider possible CRT down the road to see if it will help stabilize her and possibly permit wean of inotropes.   Can go to SDU when bed available.   Length of Stay: 3   Graciella Freer PA-C 06/23/2015, 7:27 AM  Advanced Heart Failure Team Pager [phone] (M-F; 7a - 4p)  Please contact [facility] for night-coverage after hours (4p -7a ) and weekends on [email or web address]  Patient seen and examined with Otilio Saber, PA-C. We discussed all aspects of the encounter. I agree with the assessment and plan as stated above.   She has end-stage HF. Now improved with inotropic support, She has failed previous wean of milrinone. Will continue IV diuresis one more day and then consider transition to po. Renal function continues to improve. Potassium and sodium stable. Can go to SDU.   Agree with consideration of CRT.   Bensimhon, Daniel,MD 10:10 AM

## 2015-06-24 DIAGNOSIS — M10071 Idiopathic gout, right ankle and foot: Secondary | ICD-10-CM

## 2015-06-24 LAB — BASIC METABOLIC PANEL
Anion gap: 12 (ref 5–15)
BUN: 76 mg/dL — AB (ref 6–20)
CALCIUM: 9.2 mg/dL (ref 8.9–10.3)
CO2: 39 mmol/L — ABNORMAL HIGH (ref 22–32)
CREATININE: 2.27 mg/dL — AB (ref 0.44–1.00)
Chloride: 77 mmol/L — ABNORMAL LOW (ref 101–111)
GFR calc Af Amer: 23 mL/min — ABNORMAL LOW (ref >60)
GFR, EST NON AFRICAN AMERICAN: 20 mL/min — AB (ref >60)
Glucose, Bld: 154 mg/dL — ABNORMAL HIGH (ref 65–99)
Potassium: 4.3 mmol/L (ref 3.5–5.1)
SODIUM: 128 mmol/L — AB (ref 135–145)

## 2015-06-24 LAB — CARBOXYHEMOGLOBIN
CARBOXYHEMOGLOBIN: 1.8 % — AB (ref 0.5–1.5)
Methemoglobin: 0.7 % (ref 0.0–1.5)
O2 SAT: 82.9 %
TOTAL HEMOGLOBIN: 10.9 g/dL — AB (ref 12.0–16.0)

## 2015-06-24 LAB — GLUCOSE, CAPILLARY: Glucose-Capillary: 132 mg/dL — ABNORMAL HIGH (ref 65–99)

## 2015-06-24 MED ORDER — FUROSEMIDE 10 MG/ML IJ SOLN
80.0000 mg | Freq: Once | INTRAMUSCULAR | Status: AC
  Administered 2015-06-24: 80 mg via INTRAVENOUS
  Filled 2015-06-24: qty 8

## 2015-06-24 MED ORDER — POTASSIUM CHLORIDE ER 10 MEQ PO TBCR
20.0000 meq | EXTENDED_RELEASE_TABLET | Freq: Every day | ORAL | Status: DC
  Administered 2015-06-24 – 2015-06-25 (×2): 20 meq via ORAL
  Filled 2015-06-24 (×4): qty 2

## 2015-06-24 NOTE — Progress Notes (Addendum)
Advanced Heart Failure Rounding Note   Subjective:    Feels OK. No SOB or CP. Denies orthopnea or PND. No lightheadedness. BP soft 100s.  Amio started 06/21/15 for NSVT.   Coox pending. Creatinine relatively stable.  Out ~3 L x 24 hours, weight shows down another 2 lbs. CVP 9-10.    Objective:   Weight Range:  Vital Signs:   Temp:  [98.3 F (36.8 C)] 98.3 F (36.8 C) (02/28 0400) Resp:  [17-31] 20 (02/28 0600) BP: (97-109)/(47-63) 105/61 mmHg (02/28 0600) SpO2:  [97 %-100 %] 99 % (02/28 0600) Weight:  [153 lb 10.6 oz (69.7 kg)] 153 lb 10.6 oz (69.7 kg) (02/28 0354) Last BM Date: 06/20/15  Weight change: Filed Weights   06/22/15 0423 06/23/15 0400 06/24/15 0354  Weight: 163 lb 9.3 oz (74.2 kg) 155 lb 3.3 oz (70.4 kg) 153 lb 10.6 oz (69.7 kg)    Intake/Output:   Intake/Output Summary (Last 24 hours) at 06/24/15 6295 Last data filed at 06/24/15 0600  Gross per 24 hour  Intake 1078.8 ml  Output   2500 ml  Net -1421.2 ml     Physical Exam: CVP 9-10 General:  Lying in bed. NAD HEENT: normal Neck: supple. JVP ~8-9 . Carotids 2+ bilat; no bruits. No thyromegaly or nodule noted.  Cor: PMI laterally displaced. Tachy regular + loud s3 Lungs: CTAB, normal effort Abdomen: soft, non-tender, non-distended, no HSM. No bruits or masses. +BS  Extremities: no cyanosis, clubbing, rash, Trace ankle edema Neuro: alert & orientedx3, cranial nerves grossly intact. moves all 4 extremities w/o difficulty. Affect pleasant  Telemetry: Reviewed personally, SR/ST 90-100s  Labs: Basic Metabolic Panel:  Recent Labs Lab 06/20/15 2314  06/21/15 1810 06/22/15 0430 06/22/15 1830 06/23/15 0415 06/24/15 0345  NA 132*  < > 131* 133* 129* 129* 128*  K 3.9  < > 3.9 3.6 4.0 4.0 4.3  CL 77*  < > 79* 78* 76* 77* 77*  CO2 33*  < > 36* 40* 36* 38* 39*  GLUCOSE 127*  < > 138* 124* 128* 113* 154*  BUN 78*  < > 83* 80* 76* 74* 76*  CREATININE 2.78*  < > 2.67* 2.48* 2.30* 2.23* 2.27*    CALCIUM 9.1  < > 8.9 9.1 9.3 9.1 9.2  MG 2.2  --   --   --   --   --   --   PHOS 6.2*  --   --   --   --   --   --   < > = values in this interval not displayed.  Liver Function Tests:  Recent Labs Lab 06/20/15 2314  AST 40  ALT 19  ALKPHOS 51  BILITOT 3.9*  PROT 7.3  ALBUMIN 3.0*   No results for input(s): LIPASE, AMYLASE in the last 168 hours. No results for input(s): AMMONIA in the last 168 hours.  CBC:  Recent Labs Lab 06/20/15 2314  WBC 11.7*  HGB 11.4*  HCT 32.5*  MCV 82.1  PLT 190    Cardiac Enzymes:  Recent Labs Lab 06/20/15 2314 06/21/15 0604 06/21/15 0756  TROPONINI 0.22* 0.26* 0.23*    BNP: BNP (last 3 results)  Recent Labs  06/09/15 1920 06/20/15 2314  BNP >4500.0* >4500.0*    ProBNP (last 3 results) No results for input(s): PROBNP in the last 8760 hours.    Other results:  Imaging: No results found.   Medications:     Scheduled Medications: . allopurinol  200 mg  Oral Daily  . amiodarone  200 mg Oral BID  . antiseptic oral rinse  7 mL Mouth Rinse BID  . aspirin  81 mg Oral Daily  . furosemide  80 mg Intravenous BID  . heparin  5,000 Units Subcutaneous 3 times per day  . isosorbide-hydrALAZINE  1 tablet Oral TID  . methocarbamol  500 mg Oral TID  . metolazone  5 mg Oral Daily  . potassium chloride  20 mEq Oral BID  . predniSONE  40 mg Oral Q breakfast  . sodium chloride flush  10-40 mL Intracatheter Q12H  . sodium chloride flush  3 mL Intravenous Q12H  . spironolactone  12.5 mg Oral Daily    Infusions: . sodium chloride 10 mL/hr at 06/23/15 0800  . milrinone 0.25 mcg/kg/min (06/24/15 0339)    PRN Medications: morphine injection, oxyCODONE-acetaminophen, sodium chloride flush, sorbitol   Assessment:   1. Acute on chronic systolic CHF 2/2 NICM 2. Cardiogenic shock 3. AKI on CKD III (baseline cr 1.6-1.7) 4. Severe MR 5. LBBB - 6. Acute Gout 7. Hyponatremia/hypokalemia  Plan/Discussion:    She is  much improved with milrinone. She has very mild volume on board with CVP 9-10. Will give one dose of 80 mg IV lasix this am and transition to po.  Continue po amio. Will switch to po meds today.     K+ stable. Renal function relatively stable.   Coox pending on 0.25 mcg/kg/min. She will need home milrinone, as she returns to the hospital in [facility] Class IV HF after going home without.  This may be at large Out-of-pocket cost to family.  [facility] to see and discuss further today.   With LBBB will need to consider possible CRT down the road to see if it will help stabilize her and possibly permit wean of inotropes.   Can go to SDU when bed available.   Likely home tomorrow vs thursday, once milrinone further arranged (due to cost, paperwork done)  Length of Stay: 4   Graciella Freer PA-C 06/24/2015, 7:12 AM  Advanced Heart Failure Team Pager [phone] (M-F; 7a - 4p)  Please contact [facility] for night-coverage after hours (4p -7a ) and weekends on [email or web address]  Patient seen and examined with Otilio Saber, PA-C. We discussed all aspects of the encounter. I agree with the assessment and plan as stated above.   She is stabilized on milrinone. Cardiac output up. Volume status improving. Will give one ore dose of IV lasix and switch to po. Continue po prednisone for gout. Renal function stable. Long discussion with family and [facility] about milrinone. Out of pocket payment will be $370 per week but family thinks they may be able to afford this for a while. Will f/u later today.   Bensimhon, Daniel,MD 8:42 AM

## 2015-06-24 NOTE — Progress Notes (Signed)
[facility] working w pt and fam for cpay for milrinone. [facility] will provide milrinone and pump and supplies. [facility] in [place] will provide nursing. Spoke w nse at [facility] and they are faililiar w home iv milrinone recently had case like this and confident they can handle case. Await final paperwork and ok from [facility]. Poss dc wed or thurs.

## 2015-06-25 ENCOUNTER — Inpatient Hospital Stay (HOSPITAL_COMMUNITY): Admit: 2015-06-25 | Payer: Medicare Other

## 2015-06-25 LAB — BASIC METABOLIC PANEL
Anion gap: 12 (ref 5–15)
BUN: 79 mg/dL — AB (ref 6–20)
CHLORIDE: 78 mmol/L — AB (ref 101–111)
CO2: 39 mmol/L — ABNORMAL HIGH (ref 22–32)
CREATININE: 2.14 mg/dL — AB (ref 0.44–1.00)
Calcium: 9.3 mg/dL (ref 8.9–10.3)
GFR calc Af Amer: 24 mL/min — ABNORMAL LOW (ref >60)
GFR, EST NON AFRICAN AMERICAN: 21 mL/min — AB (ref >60)
GLUCOSE: 140 mg/dL — AB (ref 65–99)
POTASSIUM: 3.7 mmol/L (ref 3.5–5.1)
SODIUM: 129 mmol/L — AB (ref 135–145)

## 2015-06-25 LAB — CARBOXYHEMOGLOBIN
Carboxyhemoglobin: 1.2 % (ref 0.5–1.5)
METHEMOGLOBIN: 0.9 % (ref 0.0–1.5)
O2 Saturation: 71.9 %
Total hemoglobin: 10.7 g/dL — ABNORMAL LOW (ref 12.0–16.0)

## 2015-06-25 MED ORDER — ASPIRIN 81 MG PO TABS: 81.0000 mg | ORAL_TABLET | Freq: Every day | ORAL | Status: DC

## 2015-06-25 MED ORDER — SPIRONOLACTONE 25 MG PO TABS: 12.5000 mg | ORAL_TABLET | Freq: Every day | ORAL | Status: AC

## 2015-06-25 MED ORDER — MILRINONE IN DEXTROSE 20 MG/100ML IV SOLN: 0.2500 ug/kg/min | INTRAVENOUS | Status: DC

## 2015-06-25 MED ORDER — TORSEMIDE 20 MG PO TABS
40.0000 mg | ORAL_TABLET | Freq: Every day | ORAL | Status: DC
  Administered 2015-06-25: 40 mg via ORAL
  Filled 2015-06-25: qty 2

## 2015-06-25 MED ORDER — POTASSIUM CHLORIDE ER 20 MEQ PO TBCR: 20.0000 meq | EXTENDED_RELEASE_TABLET | Freq: Every day | ORAL | Status: AC

## 2015-06-25 MED ORDER — AMIODARONE HCL 200 MG PO TABS: 200.0000 mg | ORAL_TABLET | Freq: Two times a day (BID) | ORAL | Status: DC

## 2015-06-25 MED ORDER — POTASSIUM CHLORIDE CRYS ER 20 MEQ PO TBCR
20.0000 meq | EXTENDED_RELEASE_TABLET | Freq: Once | ORAL | Status: AC
  Administered 2015-06-25: 20 meq via ORAL
  Filled 2015-06-25: qty 1

## 2015-06-25 NOTE — Progress Notes (Signed)
Advanced Heart Failure Rounding Note   Subjective:    Feels OK. No SOB or CP. Denies orthopnea or PND. No lightheadedness. BP soft 100s.  Amio started 06/21/15 for NSVT.   Coox 71.9% on Milrinone 0.25 mcg/kg/min. Creatinine trending down. Out ~2.5 L x 24 hours, Though weight shows up 3 lbs. CVP 9 seated in chair.  Out 5.2 L overall, Down 7-10 lbs overall, weights may be inaccurate.   Objective:   Weight Range:  Vital Signs:   Temp:  [97.8 F (36.6 C)-98.6 F (37 C)] 98.1 F (36.7 C) (03/01 0456) Resp:  [18-25] 20 (03/01 0000) BP: (89-109)/(50-65) 98/56 mmHg (03/01 0500) SpO2:  [96 %-99 %] 96 % (03/01 0456) Weight:  [156 lb 15.5 oz (71.2 kg)] 156 lb 15.5 oz (71.2 kg) (03/01 0456) Last BM Date: 06/20/15  Weight change: Filed Weights   06/23/15 0400 06/24/15 0354 06/25/15 0456  Weight: 155 lb 3.3 oz (70.4 kg) 153 lb 10.6 oz (69.7 kg) 156 lb 15.5 oz (71.2 kg)    Intake/Output:   Intake/Output Summary (Last 24 hours) at 06/25/15 0711 Last data filed at 06/25/15 0600  Gross per 24 hour  Intake  774.2 ml  Output   1900 ml  Net -1125.8 ml     Physical Exam: CVP 9, but sitting up in chair General:  Lying in bed. NAD HEENT: normal Neck: supple. JVP ~6-7 . Carotids 2+ bilat; no bruits. No thyromegaly or nodule noted.  Cor: PMI laterally displaced. Tachy regular + loud s3 Lungs: Clear, normal effort Abdomen: soft, NT, ND, no HSM. No bruits or masses. +BS  Extremities: no cyanosis, clubbing, rash, Trace ankle edema Neuro: alert & orientedx3, cranial nerves grossly intact. moves all 4 extremities w/o difficulty. Affect pleasant  Telemetry: Reviewed personally, SR/ST 90-100s  Labs: Basic Metabolic Panel:  Recent Labs Lab 06/20/15 2314  06/22/15 0430 06/22/15 1830 06/23/15 0415 06/24/15 0345 06/25/15 0400  NA 132*  < > 133* 129* 129* 128* 129*  K 3.9  < > 3.6 4.0 4.0 4.3 3.7  CL 77*  < > 78* 76* 77* 77* 78*  CO2 33*  < > 40* 36* 38* 39* 39*  GLUCOSE 127*  <  > 124* 128* 113* 154* 140*  BUN 78*  < > 80* 76* 74* 76* 79*  CREATININE 2.78*  < > 2.48* 2.30* 2.23* 2.27* 2.14*  CALCIUM 9.1  < > 9.1 9.3 9.1 9.2 9.3  MG 2.2  --   --   --   --   --   --   PHOS 6.2*  --   --   --   --   --   --   < > = values in this interval not displayed.  Liver Function Tests:  Recent Labs Lab 06/20/15 2314  AST 40  ALT 19  ALKPHOS 51  BILITOT 3.9*  PROT 7.3  ALBUMIN 3.0*   No results for input(s): LIPASE, AMYLASE in the last 168 hours. No results for input(s): AMMONIA in the last 168 hours.  CBC:  Recent Labs Lab 06/20/15 2314  WBC 11.7*  HGB 11.4*  HCT 32.5*  MCV 82.1  PLT 190    Cardiac Enzymes:  Recent Labs Lab 06/20/15 2314 06/21/15 0604 06/21/15 0756  TROPONINI 0.22* 0.26* 0.23*    BNP: BNP (last 3 results)  Recent Labs  06/09/15 1920 06/20/15 2314  BNP >4500.0* >4500.0*    ProBNP (last 3 results) No results for input(s): PROBNP in the last  8760 hours.    Other results:  Imaging: No results found.   Medications:     Scheduled Medications: . allopurinol  200 mg Oral Daily  . amiodarone  200 mg Oral BID  . antiseptic oral rinse  7 mL Mouth Rinse BID  . aspirin  81 mg Oral Daily  . heparin  5,000 Units Subcutaneous 3 times per day  . isosorbide-hydrALAZINE  1 tablet Oral TID  . methocarbamol  500 mg Oral TID  . potassium chloride  20 mEq Oral Daily  . predniSONE  40 mg Oral Q breakfast  . sodium chloride flush  10-40 mL Intracatheter Q12H  . sodium chloride flush  3 mL Intravenous Q12H  . spironolactone  12.5 mg Oral Daily    Infusions: . sodium chloride 5 mL/hr at 06/24/15 1900  . milrinone 0.25 mcg/kg/min (06/24/15 2351)    PRN Medications: morphine injection, oxyCODONE-acetaminophen, sodium chloride flush, sorbitol   Assessment:   1. Acute on chronic systolic CHF 2/2 NICM 2. Cardiogenic shock 3. AKI on CKD III (baseline cr 1.6-1.7) 4. Severe MR 5. LBBB - 6. Acute Gout 7.  Hyponatremia/hypokalemia  Plan/Discussion:    She is much improved with milrinone. Volume status stable with several doses of IV lasix. Back on po diuretics today for home.   K+ stable. Renal function trending down.   Coox stable at 71.9% on 0.25 mcg/kg/min. To go home on milrinone.    With LBBB will need to consider possible CRT down the road to see if it will help stabilize her and possibly permit wean of inotropes.   Home today pending finalization of home milrinone via [facility].   Length of Stay: 5   Graciella Freer PA-C 06/25/2015, 7:11 AM  Advanced Heart Failure Team Pager [phone] (M-F; 7a - 4p)  Please contact [facility] for night-coverage after hours (4p -7a ) and weekends on [email or web address]  Patient seen and examined with Otilio Saber, PA-C. We discussed all aspects of the encounter. I agree with the assessment and plan as stated above.   Volume status and co-ox much improved. Home milrinone arranged. Will d/c home today with [facility] f/u. Will see next week in [facility] Clinic. Will need to decide re CRT in near future.   Kaycee Haycraft,MD 1:33 PM

## 2015-06-25 NOTE — Progress Notes (Signed)
Spoke w pam w [facility] and she will hook pt up to milrinone drip. Will fax milrinone orders to [facility] in [place] also.

## 2015-06-25 NOTE — Discharge Summary (Signed)
Advanced Heart Failure Discharge Note   Discharge Summary   Patient ID: Michele Caldwell MRN: [id number], DOB/AGE: Apr 02, 1935 81 y.o. Admit date: 06/20/2015 D/C date:     06/25/2015   Primary Discharge Diagnoses:  1. Acute on chronic systolic CHF 2/2 NICM 2. Cardiogenic shock 3. AKI on CKD III (baseline cr 1.6-1.7) 4. Severe MR 5. LBBB - 6. Acute Gout 7. Hyponatremia/hypokalemia  Hospital Course:   Michele Caldwell is a 81 y.o. female admitted 06/20/15 with recurrent low output/cardiogenic shock.  She had been recently discharged on 2/22 for low output, though we were able to wean off of milrinone.  On her arrival had CVP up to her jaw with a very prominent S3 and sinus tachycardia.  Dr. Gala Romney had long talk with patient with her only options being home milrinone, which she had previously refused or palliative care/hospice.  She decided to pursue home inotropes.  Her initial coox was 40.6% on her previous admission.   Milrinone started empirically. PICC placed for monitoring of CVP and Coox. IV Amio started for NSVT on 06/21/15. Transitioned to PO with quiescence on IV amio.   She functional status rapidly improved with milrinone and dose for home set as 0.25 mcg/kg/min.  We worked with [facility] to provide home milrinone. Unfortunately, this will represent a large out of pocket cost for the family for the time being in regards to the patients insurance. Family thinks they can support this for some time.  Appreciate help from [facility].   Overall she has diuresed 4.9L and down ~10 lbs on IV lasix 80 mg BID. She will be discharged to home in stable condition on home milrinone. Her prognosis remains guarded.   Discharge Weight Range: 157 lbs Discharge Vitals: Blood pressure 107/55, pulse 101, temperature 97.9 F (36.6 C), temperature source Oral, resp. rate 20, height 5\' 6"  (1.676 m), weight 156 lb 15.5 oz (71.2 kg), SpO2 99 %.  Labs: Lab Results  Component Value Date   WBC 11.7* 06/20/2015   HGB  11.4* 06/20/2015   HCT 32.5* 06/20/2015   MCV 82.1 06/20/2015   PLT 190 06/20/2015    Recent Labs Lab 06/20/15 2314  06/25/15 0400  NA 132*  < > 129*  K 3.9  < > 3.7  CL 77*  < > 78*  CO2 33*  < > 39*  BUN 78*  < > 79*  CREATININE 2.78*  < > 2.14*  CALCIUM 9.1  < > 9.3  PROT 7.3  --   --   BILITOT 3.9*  --   --   ALKPHOS 51  --   --   ALT 19  --   --   AST 40  --   --   GLUCOSE 127*  < > 140*  < > = values in this interval not displayed. No results found for: CHOL, HDL, LDLCALC, TRIG BNP (last 3 results)  Recent Labs  06/09/15 1920 06/20/15 2314  BNP >4500.0* >4500.0*    ProBNP (last 3 results) No results for input(s): PROBNP in the last 8760 hours.   Diagnostic Studies/Procedures   No results found.  Discharge Medications     Medication List    TAKE these medications        allopurinol 100 MG tablet  Commonly known as:  ZYLOPRIM  Take 2 tablets (200 mg total) by mouth daily.     amiodarone 200 MG tablet  Commonly known as:  PACERONE  Take 1 tablet (200 mg total) by mouth  2 (two) times daily.     aspirin 81 MG tablet  Take 1 tablet (81 mg total) by mouth daily.     isosorbide-hydrALAZINE 20-37.5 MG tablet  Commonly known as:  BIDIL  Take 1 tablet by mouth 3 (three) times daily.     milrinone 20 MG/100ML Soln infusion  Commonly known as:  PRIMACOR  Inject 18.55 mcg/min into the vein continuous.     Potassium Chloride ER 20 MEQ Tbcr  Take 20 mEq by mouth daily.     spironolactone 25 MG tablet  Commonly known as:  ALDACTONE  Take 0.5 tablets (12.5 mg total) by mouth daily.     torsemide 20 MG tablet  Commonly known as:  DEMADEX  Take 2 tablets (40 mg total) by mouth daily.        Disposition   The patient will be discharged in stable condition to home. Discharge Instructions    Diet - low sodium heart healthy    Complete by:  As directed      Heart Failure patients record your daily weight using the same scale at the same time of day     Complete by:  As directed      Increase activity slowly    Complete by:  As directed           Follow-up Information    Follow up with [facility] On 07/02/2015.   Specialty:  Cardiology   Why:  at 0900 for post hospital follow up. Please bring all of your medications to your visit. The code for parking is 0010   Contact information:   [address] [id number] mc [place] [address] [phone]        Duration of Discharge Encounter: Greater than 35 minutes   Signed, Luane School 06/25/2015, 1:24 PM   Patient seen and examined with Otilio Saber, PA-C. We discussed all aspects of the encounter. I agree with the assessment and plan as stated above.   Volume status and co-ox much improved. Home milrinone arranged. Will d/c home today with [facility] f/u. Will see next week in HF Clinic. Will need to decide re CRT in near future.   Bensimhon, Daniel,MD 1:34 PM

## 2015-06-26 ENCOUNTER — Telehealth (HOSPITAL_COMMUNITY): Payer: Self-pay | Admitting: *Deleted

## 2015-06-26 MED ORDER — TORSEMIDE 20 MG PO TABS: 40.0000 mg | ORAL_TABLET | Freq: Two times a day (BID) | ORAL | Status: AC

## 2015-06-26 NOTE — Telephone Encounter (Signed)
Michele Caldwell left a VM earlier today to let us know she saw pt today for the first time as pt was d/c'd from hospital yesterday.  She reports pt has a wet productive cough and wheezing in her R lung, L lung is clear and diminished at bases.  Wt is stable at 149 lb HR 105, resp 28 BP 114/64.  Pt reports feeling ok but granddaughter states pt is having SOB with activity and even had to sleep sitting up in chair last night to help her breathe better.  She feels pt is like she was before she came to the hospital and has not been welling today at all.    Discussed w/Dr Bensimhon, he recommends pt increase Tor to 40 mg bid, called pt's granddaughter Michele Caldwell [phone]) and gave her recommendations, she is aware and verbalizes understanding, if increase dose does not help she will call us back.

## 2015-06-27 ENCOUNTER — Telehealth (HOSPITAL_COMMUNITY): Payer: Self-pay

## 2015-06-27 ENCOUNTER — Encounter (HOSPITAL_COMMUNITY): Payer: Self-pay | Admitting: Emergency Medicine

## 2015-06-27 ENCOUNTER — Inpatient Hospital Stay (HOSPITAL_COMMUNITY)
Admission: EM | Admit: 2015-06-27 | Discharge: 2015-07-02 | DRG: 291 | Disposition: A | Discharge status: Hospice | Payer: Medicare Other | Attending: Cardiovascular Disease | Admitting: Cardiovascular Disease

## 2015-06-27 ENCOUNTER — Emergency Department (HOSPITAL_COMMUNITY): Payer: Medicare Other

## 2015-06-27 DIAGNOSIS — I272 Other secondary pulmonary hypertension: Secondary | ICD-10-CM | POA: Diagnosis present

## 2015-06-27 DIAGNOSIS — M109 Gout, unspecified: Secondary | ICD-10-CM | POA: Diagnosis present

## 2015-06-27 DIAGNOSIS — E876 Hypokalemia: Secondary | ICD-10-CM | POA: Diagnosis not present

## 2015-06-27 DIAGNOSIS — I130 Hypertensive heart and chronic kidney disease with heart failure and stage 1 through stage 4 chronic kidney disease, or unspecified chronic kidney disease: Principal | ICD-10-CM | POA: Diagnosis present

## 2015-06-27 DIAGNOSIS — I429 Cardiomyopathy, unspecified: Secondary | ICD-10-CM | POA: Diagnosis present

## 2015-06-27 DIAGNOSIS — K5900 Constipation, unspecified: Secondary | ICD-10-CM | POA: Diagnosis not present

## 2015-06-27 DIAGNOSIS — N184 Chronic kidney disease, stage 4 (severe): Secondary | ICD-10-CM | POA: Diagnosis present

## 2015-06-27 DIAGNOSIS — I248 Other forms of acute ischemic heart disease: Secondary | ICD-10-CM | POA: Diagnosis present

## 2015-06-27 DIAGNOSIS — I472 Ventricular tachycardia: Secondary | ICD-10-CM | POA: Diagnosis present

## 2015-06-27 DIAGNOSIS — Z515 Encounter for palliative care: Secondary | ICD-10-CM | POA: Insufficient documentation

## 2015-06-27 DIAGNOSIS — I340 Nonrheumatic mitral (valve) insufficiency: Secondary | ICD-10-CM | POA: Diagnosis present

## 2015-06-27 DIAGNOSIS — Z95810 Presence of automatic (implantable) cardiac defibrillator: Secondary | ICD-10-CM | POA: Diagnosis not present

## 2015-06-27 DIAGNOSIS — E871 Hypo-osmolality and hyponatremia: Secondary | ICD-10-CM | POA: Diagnosis present

## 2015-06-27 DIAGNOSIS — Z66 Do not resuscitate: Secondary | ICD-10-CM | POA: Diagnosis not present

## 2015-06-27 DIAGNOSIS — N183 Chronic kidney disease, stage 3 unspecified: Secondary | ICD-10-CM | POA: Diagnosis present

## 2015-06-27 DIAGNOSIS — I5042 Chronic combined systolic (congestive) and diastolic (congestive) heart failure: Secondary | ICD-10-CM

## 2015-06-27 DIAGNOSIS — I110 Hypertensive heart disease with heart failure: Secondary | ICD-10-CM

## 2015-06-27 DIAGNOSIS — I447 Left bundle-branch block, unspecified: Secondary | ICD-10-CM | POA: Diagnosis present

## 2015-06-27 DIAGNOSIS — I119 Hypertensive heart disease without heart failure: Secondary | ICD-10-CM | POA: Diagnosis present

## 2015-06-27 DIAGNOSIS — I5043 Acute on chronic combined systolic (congestive) and diastolic (congestive) heart failure: Secondary | ICD-10-CM | POA: Diagnosis present

## 2015-06-27 DIAGNOSIS — G9349 Other encephalopathy: Secondary | ICD-10-CM | POA: Insufficient documentation

## 2015-06-27 DIAGNOSIS — F419 Anxiety disorder, unspecified: Secondary | ICD-10-CM | POA: Diagnosis not present

## 2015-06-27 DIAGNOSIS — G9340 Encephalopathy, unspecified: Secondary | ICD-10-CM | POA: Diagnosis present

## 2015-06-27 DIAGNOSIS — I071 Rheumatic tricuspid insufficiency: Secondary | ICD-10-CM | POA: Diagnosis present

## 2015-06-27 DIAGNOSIS — I509 Heart failure, unspecified: Secondary | ICD-10-CM

## 2015-06-27 DIAGNOSIS — R0602 Shortness of breath: Secondary | ICD-10-CM | POA: Diagnosis present

## 2015-06-27 DIAGNOSIS — R570 Cardiogenic shock: Secondary | ICD-10-CM | POA: Diagnosis present

## 2015-06-27 DIAGNOSIS — I5023 Acute on chronic systolic (congestive) heart failure: Secondary | ICD-10-CM | POA: Insufficient documentation

## 2015-06-27 DIAGNOSIS — N19 Unspecified kidney failure: Secondary | ICD-10-CM | POA: Insufficient documentation

## 2015-06-27 LAB — BASIC METABOLIC PANEL
ANION GAP: 17 — AB (ref 5–15)
BUN: 91 mg/dL — ABNORMAL HIGH (ref 6–20)
CALCIUM: 9.4 mg/dL (ref 8.9–10.3)
CO2: 33 mmol/L — ABNORMAL HIGH (ref 22–32)
Chloride: 76 mmol/L — ABNORMAL LOW (ref 101–111)
Creatinine, Ser: 2.26 mg/dL — ABNORMAL HIGH (ref 0.44–1.00)
GFR calc Af Amer: 23 mL/min — ABNORMAL LOW (ref >60)
GFR, EST NON AFRICAN AMERICAN: 20 mL/min — AB (ref >60)
GLUCOSE: 142 mg/dL — AB (ref 65–99)
POTASSIUM: 3.5 mmol/L (ref 3.5–5.1)
SODIUM: 126 mmol/L — AB (ref 135–145)

## 2015-06-27 LAB — I-STAT TROPONIN, ED: TROPONIN I, POC: 0.89 ng/mL — AB (ref 0.00–0.08)

## 2015-06-27 LAB — CBC
HEMATOCRIT: 35.6 % — AB (ref 36.0–46.0)
Hemoglobin: 12.4 g/dL (ref 12.0–15.0)
MCH: 27.7 pg (ref 26.0–34.0)
MCHC: 34.8 g/dL (ref 30.0–36.0)
MCV: 79.5 fL (ref 78.0–100.0)
PLATELETS: 267 10*3/uL (ref 150–400)
RBC: 4.48 MIL/uL (ref 3.87–5.11)
RDW: 17.3 % — AB (ref 11.5–15.5)
WBC: 6.1 10*3/uL (ref 4.0–10.5)

## 2015-06-27 MED ORDER — SODIUM CHLORIDE 0.9 % IV SOLN: 250.0000 mL | INTRAVENOUS | Status: DC | PRN

## 2015-06-27 MED ORDER — SODIUM CHLORIDE 0.9% FLUSH
3.0000 mL | Freq: Two times a day (BID) | INTRAVENOUS | Status: DC
  Administered 2015-06-28 – 2015-07-01 (×7): 3 mL via INTRAVENOUS

## 2015-06-27 MED ORDER — SODIUM CHLORIDE 0.9% FLUSH: 3.0000 mL | INTRAVENOUS | Status: DC | PRN

## 2015-06-27 MED ORDER — ACETAMINOPHEN 325 MG PO TABS
650.0000 mg | ORAL_TABLET | ORAL | Status: DC | PRN
  Administered 2015-06-29 – 2015-07-02 (×5): 650 mg via ORAL
  Filled 2015-06-27 (×5): qty 2

## 2015-06-27 MED ORDER — ONDANSETRON HCL 4 MG/2ML IJ SOLN: 4.0000 mg | Freq: Four times a day (QID) | INTRAMUSCULAR | Status: DC | PRN

## 2015-06-27 MED ORDER — FUROSEMIDE 10 MG/ML IJ SOLN
40.0000 mg | Freq: Once | INTRAMUSCULAR | Status: AC
  Administered 2015-06-27: 40 mg via INTRAVENOUS
  Filled 2015-06-27: qty 4

## 2015-06-27 MED ORDER — HEPARIN SODIUM (PORCINE) 5000 UNIT/ML IJ SOLN
5000.0000 [IU] | Freq: Three times a day (TID) | INTRAMUSCULAR | Status: DC
  Administered 2015-06-28 (×3): 5000 [IU] via SUBCUTANEOUS
  Filled 2015-06-27 (×4): qty 1

## 2015-06-27 MED ORDER — ASPIRIN EC 81 MG PO TBEC
81.0000 mg | DELAYED_RELEASE_TABLET | Freq: Every day | ORAL | Status: DC
  Administered 2015-06-28 – 2015-06-30 (×3): 81 mg via ORAL
  Filled 2015-06-27 (×4): qty 1

## 2015-06-27 MED ORDER — IPRATROPIUM-ALBUTEROL 0.5-2.5 (3) MG/3ML IN SOLN
3.0000 mL | RESPIRATORY_TRACT | Status: DC | PRN
Start: 2015-06-27 — End: 2015-07-02

## 2015-06-27 NOTE — ED Notes (Signed)
This patient screened by [facility]. Pt family member states shes a "heart failure patient". Pt complains of being sent here by her PCP. Pt c/o "fluid in my legs". Pt denies CP/SOB or any other symptoms.

## 2015-06-27 NOTE — ED Provider Notes (Signed)
CSN: [id number]     Arrival date & time 06/27/15  1945 History   First MD Initiated Contact with Patient 06/27/15 2108     Chief Complaint  Patient presents with  . Congestive Heart Failure     (Consider location/radiation/quality/duration/timing/severity/associated sxs/prior Treatment) HPI Treatment of recurrent episodes of congestive heart failure with severe MR. She has had multiple re-hospitalizations with the last being 2 days ago. Patient has very poor capacity as a historian. Her family member who cares for her directly reports that although the patient has not specifically make complaints, she has observed her to have increasing difficulty breathing with increasing generalized weakness that are consistent with her previous episodes of congestive heart failure. Her family member reports she has not specifically had any complaints of chest pain, however she also adds that the patient would never report chest pain regardless if she had in her not. Her family member reports 3 pounds of fluid weight gain since yesterday. The patient has been getting IV milrinone. Past Medical History  Diagnosis Date  . Chronic systolic CHF (congestive heart failure) (HCC)     a. 06/2006 EF 20%; b. 04/2007 EF 15%; c. 01/2010 EF 15%; d, 05/2015 Echo: EF 20%, diff HK, inf, infsept, post AK, sev dil LV w/ mild LVH, sev MR, sev dil LA, mildly reduced RV fxn, mildly dil RA, mod TR, PASP .  [name] NICM (nonischemic cardiomyopathy) (HCC)     a. 06/2006 Cath: nl cors;  b. 05/2007 BSX X588 Confient Single Lead AICD; c. 05/2015 Echo: EF 15-20%.  . Severe mitral regurgitation     a. 05/2015 Echo: Severe MR.  . CKD (chronic kidney disease), stage III   . Moderate tricuspid regurgitation   . PAH (pulmonary artery hypertension) (HCC)     a. 05/2015 Echo: PASP .  [name] AICD (automatic cardioverter/defibrillator) present     a. 05/2007 BSX E030 Confient Single Lead AICD.  [name] Hypertensive heart disease    Past Surgical History   Procedure Laterality Date  . Cardiac catheterization  2008    nl cors, EF 20%  . Cardiac defibrillator placement  2009    [employer] Vitality II ICD   Family History  Problem Relation Age of Onset  . Cancer    . Heart failure    . Hypertension Mother   . Heart disease Mother   . Heart failure Mother   . Kidney disease Mother   . Cancer Father     BACK  . Heart attack Mother    Social History  Substance Use Topics  . Smoking status: Never Smoker   . Smokeless tobacco: Never Used  . Alcohol Use: No   OB History    No data available     Review of Systems Cannot perform review of systems due to patient condition and dementia level V caveat. All history and review of systems is from the patient's family member.   Allergies  Ace inhibitors  Home Medications   Prior to Admission medications   Medication Sig Start Date End Date Taking? Authorizing Provider  allopurinol (ZYLOPRIM) 100 MG tablet Take 2 tablets (200 mg total) by mouth daily. 06/18/15  Yes Amy D Clegg, NP  potassium chloride 20 MEQ TBCR Take 20 mEq by mouth daily. 06/25/15  Yes Graciella Freer, PA-C  spironolactone (ALDACTONE) 25 MG tablet Take 0.5 tablets (12.5 mg total) by mouth daily. 06/25/15  Yes Graciella Freer, PA-C  torsemide (DEMADEX) 20 MG tablet Take 2 tablets (40  mg total) by mouth 2 (two) times daily. 06/26/15  Yes Dolores Patty, MD  ALPRAZolam Prudy Feeler) 0.25 MG tablet Take 1 tablet (0.25 mg total) by mouth 3 (three) times daily as needed for anxiety. 07/02/15   Amy D Clegg, NP   BP 91/51 mmHg  Pulse 97  Temp(Src) 98.5 F (36.9 C) (Oral)  Resp 20  Ht  (1.676 m)  Wt 151 lb 1.6 oz (68.539 kg)  BMI 24.40 kg/m2  SpO2 96% Physical Exam  Constitutional:  Patient is an elderly, frail-appearing female. She has moderate respiratory distress. She is awake and alert. She is not diaphoretic.  HENT:  Head: Normocephalic and atraumatic.  Eyes: EOM are normal.  Cardiovascular:   Tachycardia, 2/6 systolic ejection murmur  Pulmonary/Chest:  Moderate increased work of breathing. Breath sounds are diffusely very soft. Occasionally fine expiratory wheeze.  Abdominal: Soft. She exhibits no distension. There is no tenderness.  Musculoskeletal: Normal range of motion. She exhibits no edema or tenderness.  Neurological: She is alert. Coordination normal.  Patient is given very limited verbal responses. She will have eye contact and assist in some physical exam requests. There does not appear to be localizing neurologic deficit.  Skin: Skin is warm and dry.  Psychiatric:  Patient is calm and cooperative.    ED Course  Procedures (including critical care time) Labs Review Labs Reviewed  BASIC METABOLIC PANEL - Abnormal; Notable for the following:    Sodium 126 (*)    Chloride 76 (*)    CO2 33 (*)    Glucose, Bld 142 (*)    BUN 91 (*)    Creatinine, Ser 2.26 (*)    GFR calc non Af Amer 20 (*)    GFR calc Af Amer 23 (*)    Anion gap 17 (*)    All other components within normal limits  CBC - Abnormal; Notable for the following:    HCT 35.6 (*)    RDW 17.3 (*)    All other components within normal limits  BRAIN NATRIURETIC PEPTIDE - Abnormal; Notable for the following:    B Natriuretic Peptide 1267.7 (*)    All other components within normal limits  CARBOXYHEMOGLOBIN - Abnormal; Notable for the following:    Carboxyhemoglobin 2.2 (*)    All other components within normal limits  TROPONIN I - Abnormal; Notable for the following:    Troponin I 0.06 (*)    All other components within normal limits  TROPONIN I - Abnormal; Notable for the following:    Troponin I 0.09 (*)    All other components within normal limits  TROPONIN I - Abnormal; Notable for the following:    Troponin I 0.08 (*)    All other components within normal limits  CBC - Abnormal; Notable for the following:    HCT 35.6 (*)    RDW 17.2 (*)    All other components within normal limits   CREATININE, SERUM - Abnormal; Notable for the following:    Creatinine, Ser 2.20 (*)    GFR calc non Af Amer 20 (*)    GFR calc Af Amer 23 (*)    All other components within normal limits  BASIC METABOLIC PANEL - Abnormal; Notable for the following:    Sodium 130 (*)    Potassium 3.3 (*)    Chloride 79 (*)    CO2 33 (*)    Glucose, Bld 119 (*)    BUN 93 (*)    Creatinine, Ser 2.35 (*)  GFR calc non Af Amer 19 (*)    GFR calc Af Amer 22 (*)    Anion gap 18 (*)    All other components within normal limits  CARBOXYHEMOGLOBIN - Abnormal; Notable for the following:    Total hemoglobin 11.8 (*)    Carboxyhemoglobin 1.8 (*)    All other components within normal limits  BASIC METABOLIC PANEL - Abnormal; Notable for the following:    Sodium 130 (*)    Chloride 79 (*)    CO2 34 (*)    Glucose, Bld 118 (*)    BUN 84 (*)    Creatinine, Ser 2.38 (*)    GFR calc non Af Amer 18 (*)    GFR calc Af Amer 21 (*)    Anion gap 17 (*)    All other components within normal limits  CARBOXYHEMOGLOBIN - Abnormal; Notable for the following:    Total hemoglobin 11.9 (*)    Carboxyhemoglobin 1.6 (*)    All other components within normal limits  CBC - Abnormal; Notable for the following:    Hemoglobin 11.9 (*)    HCT 34.0 (*)    RDW 17.3 (*)    All other components within normal limits  URINALYSIS, ROUTINE W REFLEX MICROSCOPIC (NOT AT [facility]) - Abnormal; Notable for the following:    Hgb urine dipstick LARGE (*)    All other components within normal limits  URINE MICROSCOPIC-ADD ON - Abnormal; Notable for the following:    Squamous Epithelial / LPF 0-5 (*)    Bacteria, UA FEW (*)    Casts HYALINE CASTS (*)    All other components within normal limits  BASIC METABOLIC PANEL - Abnormal; Notable for the following:    Sodium 130 (*)    Chloride 80 (*)    CO2 33 (*)    Glucose, Bld 105 (*)    BUN 82 (*)    Creatinine, Ser 2.56 (*)    GFR calc non Af Amer 17 (*)    GFR calc Af Amer 20 (*)     Anion gap 17 (*)    All other components within normal limits  CARBOXYHEMOGLOBIN - Abnormal; Notable for the following:    Total hemoglobin 11.4 (*)    Carboxyhemoglobin 1.8 (*)    All other components within normal limits  CBC - Abnormal; Notable for the following:    Hemoglobin 11.2 (*)    HCT 32.2 (*)    RDW 17.3 (*)    All other components within normal limits  CARBOXYHEMOGLOBIN - Abnormal; Notable for the following:    Total hemoglobin 11.7 (*)    All other components within normal limits  BASIC METABOLIC PANEL - Abnormal; Notable for the following:    Sodium 130 (*)    Chloride 83 (*)    CO2 34 (*)    Glucose, Bld 102 (*)    BUN 69 (*)    Creatinine, Ser 2.44 (*)    GFR calc non Af Amer 18 (*)    GFR calc Af Amer 21 (*)    All other components within normal limits  I-STAT TROPOININ, ED - Abnormal; Notable for the following:    Troponin i, poc 0.89 (*)    All other components within normal limits  MRSA PCR SCREENING  URINALYSIS, ROUTINE W REFLEX MICROSCOPIC (NOT AT [facility])  AMMONIA  MAGNESIUM    Imaging Review No results found. I have personally reviewed and evaluated these images and lab results as part of my  medical decision-making.   EKG Interpretation   Date/Time:  Friday June 27 2015 20:40:29 EST Ventricular Rate:  110 PR Interval:  138 QRS Duration: 194 QT Interval:  392 QTC Calculation: 530 R Axis:   95 Text Interpretation:  Sinus tachycardia Possible Left atrial enlargement  Rightward axis Left ventricular hypertrophy with QRS widening and  repolarization abnormality Inferior infarct , age undetermined Abnormal  ECG concern for ischemic changes c/w old Confirmed by Donnald Garre, MD, Lebron Conners  [phone]) on 06/27/2015 8:47:26 PM      Consult:  (21:40) discussed with Dr. Duke Salvia of cardiology. Reviewed EKG and comparisons and troponin. At this time recommends for administration of Lasix and will see the patient in the emergency department. MDM   Final  diagnoses:  Acute on chronic systolic heart failure, NYHA class 4 (HCC)   Patient presents with recurrence of acute on chronic congestive heart failure. She does have moderate respiratory distress but does not require airway adjuncts at this time. Cardiology has been consult it and Dr. Duke Salvia evaluated the patient in the emergency department for ongoing care and admission.    Arby Barrette, MD 07/04/15 [phone]

## 2015-06-27 NOTE — ED Notes (Signed)
Pt voided in bed. Post bladder scan 420 ml.

## 2015-06-27 NOTE — Telephone Encounter (Signed)
Michele Caldwell with [facility] called to report patient still not feeling well and has had 3 lb weight gain since speaking with CHF clinic yesterday. 102/60 24RR 100 HR Lung sounds a little better - L fine crackles in bases, R clear Patient is a full 2 assist, very weak, L back pain, and family feels something is "just not right".  Advised to come to ED.  Ave Filter

## 2015-06-27 NOTE — ED Notes (Signed)
Pt with worsening weakness since discharge on Wednesday. Pt unable to stand up; lifted to bed by staff members. Pt denies chest pain; Per granddaughter pt has been c/o back pain and belching today. Pt with bilateral pedal edema

## 2015-06-27 NOTE — ED Notes (Addendum)
Family member reports 3 lb weight gain since yesterday.  Home health RN reported that pt had "fluid on lungs" yesterday and today.  Pt d/c from hospital on Wednesday and started IV Milrinone.  Family reports increased generalized weakness since discharge.  Pt denies pain,sob, or any other symptoms.

## 2015-06-27 NOTE — H&P (Signed)
Patient ID: Naveh Rickles MRN: [id number] DOB/AGE: 1935-03-09 81 y.o.  Admit date: 06/27/2015 Primary Physician  No PCP Per Patient  Primary Cardiologist   Berton Mount, MD Chief Complaint    Shortness of breath   HPI: Ms. Birden is a 81F with chronic systolic and diastolic heart failure on milrinone, LBBB, s/p ICD, severe MR, hypertension, pulmonary hypertensin, CKD 3 recently discharged twice with cardiogenic shock back with acute on chronic systolic heart failure.  Ms. Checo was recently admitted 2/24-3/1 with heart failure.  She was started on milrinone and diuresed with IV lasix.  She was 157lb on the day of discharge.  She was also started on amiodarone for NSVT.  Prior to that she was admitted 2/13-2/22. That hospitalization she required milrinone for diuresis but was transitioned off milrinone prior to discharge.    Ms. Tung is accompanied by her granddaughter, who provides the history.  After being discharged from the hospital Ms. Mao was initially doing well.  Her appetite had improved and she had a lot of energy.  However, the day after returning home she started to become weak and increasingly short of breath.  She also noted a wet cough (different from her chronic, dry cough), and wheezing.  The cough was not productive and she has been afebrile.  She has not noted increased lower extremity edema.  Ms. Ehinger has been unable to lay in the bed.  She has been sleeping either in a recliner or on the couch due to orthopnea.  She called the heart failure clinic on 3/2 and Dr. Gala Romney recommended increasing her torsemide to 40 mg bid. Today, Ms. Beamon's weight increased by 3lb.  Despite this, she was up all night urinating every 30 minutes.  Her granddaughter notes that she had a robust response to torsemide.  She was unable to assist her granddaughter with transfers because she was so weak.  Ms. Tolosa is unable to provide history, but she reportedly told her granddaughter that she was  feeling confused.  She reported these symptoms to the heart failure clinic, who recommended that she come to the hospital for evaluation.  In the ED Ms. Lopezmartinez's weight was 150 lb. Her renal function was slightly elevated from 2.14 to 2.26.  Troponin was mildly elevated at 0.06.  She was hemodynamically stable.  Cardiology was called for evaluation.   Review of Systems:  Unable to obtain.  Patient not responding to questions.     Past Medical History  Diagnosis Date  . Chronic systolic CHF (congestive heart failure) (HCC)     a. 06/2006 EF 20%; b. 04/2007 EF 15%; c. 01/2010 EF 15%; d, 05/2015 Echo: EF 20%, diff HK, inf, infsept, post AK, sev dil LV w/ mild LVH, sev MR, sev dil LA, mildly reduced RV fxn, mildly dil RA, mod TR, PASP .  [name] NICM (nonischemic cardiomyopathy) (HCC)     a. 06/2006 Cath: nl cors;  b. 05/2007 BSX W098 Confient Single Lead AICD; c. 05/2015 Echo: EF 15-20%.  . Severe mitral regurgitation     a. 05/2015 Echo: Severe MR.  . CKD (chronic kidney disease), stage III   . Moderate tricuspid regurgitation   . PAH (pulmonary artery hypertension) (HCC)     a. 05/2015 Echo: PASP .  [name] AICD (automatic cardioverter/defibrillator) present     a. 05/2007 BSX E030 Confient Single Lead AICD.  [name] Hypertensive heart disease     (Not in a hospital admission)   Allergies  Allergen Reactions  .  Ace Inhibitors Other (See Comments) and Cough    Also reported angioedema    Social History   Social History  . Marital Status: Single    Spouse Name: N/A  . Number of Children: N/A  . Years of Education: N/A   Occupational History  . Retired    Social History Main Topics  . Smoking status: Never Smoker   . Smokeless tobacco: Never Used  . Alcohol Use: No  . Drug Use: No  . Sexual Activity: Not on file   Other Topics Concern  . Not on file   Social History Narrative    Family History  Problem Relation Age of Onset  . Cancer    . Heart failure    . Hypertension  Mother   . Heart disease Mother   . Heart failure Mother   . Kidney disease Mother   . Cancer Father     BACK  . Heart attack Mother     PHYSICAL EXAM: Filed Vitals:   06/27/15 2123 06/27/15 2130  BP: 115/63 109/67  Pulse: 106 106  Temp: 98 F (36.7 C)   Resp: 28 22   General:  Chronically ill-appearing. Mild respiratory distress.   HEENT: normal Neck: supple. JVP 1cm above clavicle at 45 degrees.  Carotids 2+ bilat; no bruits. No lymphadenopathy or thryomegaly appreciated. Cor: PMI laterally displaced.  Tachycardic.  Regular rhythm.  S3 and S4 appreciated.   + RV heave. Lungs: Diffuse expiratory wheezes.  No crackles or rhonchi.  Abdomen: soft, nontender, nondistended. No hepatosplenomegaly. No bruits or masses. Good bowel sounds. Extremities: no cyanosis, clubbing, rash, edema Neuro: Lethargic and not answering questions.  Refers questions to her granddaughter.  Cranial nerves grossly intact. Moves all 4 extremities w/o difficulty.    Results for orders placed or performed during the hospital encounter of 06/27/15 (from the past 24 hour(s))  Basic metabolic panel     Status: Abnormal   Collection Time: 06/27/15  9:16 PM  Result Value Ref Range   Sodium 126 (L) 135 - 145 mmol/L   Potassium 3.5 3.5 - 5.1 mmol/L   Chloride 76 (L) 101 - 111 mmol/L   CO2 33 (H) 22 - 32 mmol/L   Glucose, Bld 142 (H) 65 - 99 mg/dL   BUN 91 (H) 6 - 20 mg/dL   Creatinine, Ser 4.09 (H) 0.44 - 1.00 mg/dL   Calcium 9.4 8.9 - 81.1 mg/dL   GFR calc non Af Amer 20 (L) >60 mL/min   GFR calc Af Amer 23 (L) >60 mL/min   Anion gap 17 (H) 5 - 15  CBC     Status: Abnormal   Collection Time: 06/27/15  9:16 PM  Result Value Ref Range   WBC 6.1 4.0 - 10.5 K/uL   RBC 4.48 3.87 - 5.11 MIL/uL   Hemoglobin 12.4 12.0 - 15.0 g/dL   HCT 91.4 (L) 78.2 - 95.6 %   MCV 79.5 78.0 - 100.0 fL   MCH 27.7 26.0 - 34.0 pg   MCHC 34.8 30.0 - 36.0 g/dL   RDW 21.3 (H) 08.6 - 57.8 %   Platelets 267 150 - 400 K/uL  I-stat  troponin, ED (not at [facility])     Status: Abnormal   Collection Time: 06/27/15  9:19 PM  Result Value Ref Range   Troponin i, poc 0.89 (HH) 0.00 - 0.08 ng/mL   Comment NOTIFIED PHYSICIAN    Comment 3  Dg Chest Portable 1 View  06/27/2015  CLINICAL DATA:  Decreased breath sounds, no fever, history of congestive heart failure and renal disease EXAM: PORTABLE CHEST 1 VIEW COMPARISON:  06/20/2015 FINDINGS: Significant enlargement of the cardiac silhouette stable. Single lead defibrillator unchanged. Right PICC line new from prior study with tip near the cavoatrial junction. No pneumothorax. There is mild to moderate central vascular congestion slightly less prominent when compared to the prior study. No definite pulmonary edema. IMPRESSION: Cardiac enlargement and vascular congestion but without definite evidence of pulmonary edema. Electronically Signed   By: Esperanza Heir M.D.   On: 06/27/2015 21:36    ECG: Sinus tachycardia.  Rate 110.  LBBB.   Echo 06/09/15: Study Conclusions  - Left ventricle: LVEF is severely depressed at approximately 15 to 20% with diffuse hypokinesis; inferior, inferoseptal and posterior akinesis. The cavity size was severely dilated. Wall thickness was increased in a pattern of mild LVH. - Mitral valve: There was severe regurgitation. - Left atrium: The atrium was severely dilated. - Right ventricle: Systolic function was mildly reduced. - Right atrium: The atrium was mildly dilated. - Tricuspid valve: There was moderate regurgitation. - Pulmonary arteries: PA peak pressure: 55 mm Hg (S).   ASSESSMENT/PLAN:  # Chronic systolic and diastolic heart failure, SOB: Ms. Stimmell is here with her third admission in 2 months.  However, it is not clear to me that heart failure is the main cause of this admission. She actually seems to be near euvolemic.  Her weight is actually below her discharge weight.  She does have wheezing on exam, but no crackles and  CXR shows not pulmonary edema.  She does not have a leukocytosis and her cough is non-productive, making it less likely that she has pneumonia.   - Check co-ox and BNP - Continue torsemide 40 mg bid - Continue milrinone 0.25 mcg/kg/min - Continue bidil and spironolactone - Nebs  # NSVT: Not an active issue.  Continue amiodarone.  # Encephalopathy, weakness: Ms. Arvizu seems to be confused and lethargic.  It is unclear how much of this is attributable to heart failure, vs. Infection, vs. Metabolic.  She is not on any medications that should be causing this.  Her BUN is now 91, up from the 70s.   - Check urinalysis - evaluate for worsened HF as above - Renal consult if uremia worsening  # Demand ischemia: Troponin is mildy elevated.  Ms. Mullan denies chest pain, but her granddaughter notes that this is unreliable.  Her EKG has a LBBB.  We will cycle her cardiac enzymes.  No heparin for now.  Continue aspirin.  Signed: Ezme Duch C. Duke Salvia, MD, [facility]  06/27/2015, 10:02 PM

## 2015-06-27 NOTE — ED Notes (Signed)
Pt placed on bedpan numerous times; unable to urinate

## 2015-06-28 ENCOUNTER — Inpatient Hospital Stay (HOSPITAL_COMMUNITY): Payer: Medicare Other

## 2015-06-28 DIAGNOSIS — N19 Unspecified kidney failure: Secondary | ICD-10-CM | POA: Insufficient documentation

## 2015-06-28 DIAGNOSIS — N189 Chronic kidney disease, unspecified: Secondary | ICD-10-CM

## 2015-06-28 DIAGNOSIS — N179 Acute kidney failure, unspecified: Secondary | ICD-10-CM

## 2015-06-28 DIAGNOSIS — R079 Chest pain, unspecified: Secondary | ICD-10-CM

## 2015-06-28 DIAGNOSIS — E876 Hypokalemia: Secondary | ICD-10-CM

## 2015-06-28 DIAGNOSIS — G9349 Other encephalopathy: Secondary | ICD-10-CM | POA: Insufficient documentation

## 2015-06-28 DIAGNOSIS — I5023 Acute on chronic systolic (congestive) heart failure: Secondary | ICD-10-CM | POA: Insufficient documentation

## 2015-06-28 LAB — URINALYSIS, ROUTINE W REFLEX MICROSCOPIC
BILIRUBIN URINE: NEGATIVE
GLUCOSE, UA: NEGATIVE mg/dL
Hgb urine dipstick: NEGATIVE
KETONES UR: NEGATIVE mg/dL
LEUKOCYTES UA: NEGATIVE
NITRITE: NEGATIVE
PH: 6.5 (ref 5.0–8.0)
PROTEIN: NEGATIVE mg/dL
Specific Gravity, Urine: 1.005 (ref 1.005–1.030)

## 2015-06-28 LAB — CBC
HCT: 35.6 % — ABNORMAL LOW (ref 36.0–46.0)
Hemoglobin: 12.3 g/dL (ref 12.0–15.0)
MCH: 27.5 pg (ref 26.0–34.0)
MCHC: 34.6 g/dL (ref 30.0–36.0)
MCV: 79.6 fL (ref 78.0–100.0)
PLATELETS: 268 10*3/uL (ref 150–400)
RBC: 4.47 MIL/uL (ref 3.87–5.11)
RDW: 17.2 % — ABNORMAL HIGH (ref 11.5–15.5)
WBC: 6.2 10*3/uL (ref 4.0–10.5)

## 2015-06-28 LAB — BASIC METABOLIC PANEL
ANION GAP: 18 — AB (ref 5–15)
BUN: 93 mg/dL — ABNORMAL HIGH (ref 6–20)
CALCIUM: 9.4 mg/dL (ref 8.9–10.3)
CHLORIDE: 79 mmol/L — AB (ref 101–111)
CO2: 33 mmol/L — ABNORMAL HIGH (ref 22–32)
Creatinine, Ser: 2.35 mg/dL — ABNORMAL HIGH (ref 0.44–1.00)
GFR calc Af Amer: 22 mL/min — ABNORMAL LOW (ref >60)
GFR, EST NON AFRICAN AMERICAN: 19 mL/min — AB (ref >60)
GLUCOSE: 119 mg/dL — AB (ref 65–99)
POTASSIUM: 3.3 mmol/L — AB (ref 3.5–5.1)
SODIUM: 130 mmol/L — AB (ref 135–145)

## 2015-06-28 LAB — CARBOXYHEMOGLOBIN
CARBOXYHEMOGLOBIN: 1.8 % — AB (ref 0.5–1.5)
CARBOXYHEMOGLOBIN: 2.2 % — AB (ref 0.5–1.5)
METHEMOGLOBIN: 0.5 % (ref 0.0–1.5)
METHEMOGLOBIN: 0.6 % (ref 0.0–1.5)
O2 SAT: 92.9 %
O2 Saturation: 67.2 %
TOTAL HEMOGLOBIN: 11.8 g/dL — AB (ref 12.0–16.0)
Total hemoglobin: 12.7 g/dL (ref 12.0–16.0)

## 2015-06-28 LAB — BRAIN NATRIURETIC PEPTIDE: B NATRIURETIC PEPTIDE 5: 1267.7 pg/mL — AB (ref 0.0–100.0)

## 2015-06-28 LAB — TROPONIN I
TROPONIN I: 0.06 ng/mL — AB (ref <0.031)
TROPONIN I: 0.09 ng/mL — AB (ref <0.031)
Troponin I: 0.08 ng/mL — ABNORMAL HIGH (ref <0.031)

## 2015-06-28 LAB — CREATININE, SERUM
Creatinine, Ser: 2.2 mg/dL — ABNORMAL HIGH (ref 0.44–1.00)
GFR calc Af Amer: 23 mL/min — ABNORMAL LOW (ref >60)
GFR calc non Af Amer: 20 mL/min — ABNORMAL LOW (ref >60)

## 2015-06-28 LAB — MRSA PCR SCREENING: MRSA BY PCR: NEGATIVE

## 2015-06-28 MED ORDER — AMIODARONE HCL 200 MG PO TABS
200.0000 mg | ORAL_TABLET | Freq: Two times a day (BID) | ORAL | Status: DC
  Administered 2015-06-28 – 2015-06-30 (×7): 200 mg via ORAL
  Filled 2015-06-28 (×8): qty 1

## 2015-06-28 MED ORDER — ALLOPURINOL 100 MG PO TABS
200.0000 mg | ORAL_TABLET | Freq: Every day | ORAL | Status: DC
  Administered 2015-06-28 – 2015-07-02 (×5): 200 mg via ORAL
  Filled 2015-06-28 (×5): qty 2

## 2015-06-28 MED ORDER — ISOSORB DINITRATE-HYDRALAZINE 20-37.5 MG PO TABS
1.0000 | ORAL_TABLET | Freq: Three times a day (TID) | ORAL | Status: DC
  Administered 2015-06-28 – 2015-06-30 (×10): 1 via ORAL
  Filled 2015-06-28 (×11): qty 1

## 2015-06-28 MED ORDER — CETYLPYRIDINIUM CHLORIDE 0.05 % MT LIQD
7.0000 mL | Freq: Two times a day (BID) | OROMUCOSAL | Status: DC
  Administered 2015-06-28 – 2015-07-02 (×8): 7 mL via OROMUCOSAL

## 2015-06-28 MED ORDER — TORSEMIDE 20 MG PO TABS
40.0000 mg | ORAL_TABLET | Freq: Two times a day (BID) | ORAL | Status: DC
  Administered 2015-06-28 – 2015-07-02 (×9): 40 mg via ORAL
  Filled 2015-06-28 (×9): qty 2

## 2015-06-28 MED ORDER — POTASSIUM CHLORIDE CRYS ER 20 MEQ PO TBCR
20.0000 meq | EXTENDED_RELEASE_TABLET | Freq: Every day | ORAL | Status: DC
  Administered 2015-06-28 – 2015-07-02 (×5): 20 meq via ORAL
  Filled 2015-06-28 (×5): qty 1

## 2015-06-28 MED ORDER — SPIRONOLACTONE 25 MG PO TABS
12.5000 mg | ORAL_TABLET | Freq: Every day | ORAL | Status: DC
  Administered 2015-06-28 – 2015-07-02 (×5): 12.5 mg via ORAL
  Filled 2015-06-28 (×5): qty 1

## 2015-06-28 MED ORDER — MILRINONE IN DEXTROSE 20 MG/100ML IV SOLN
0.2500 ug/kg/min | INTRAVENOUS | Status: DC
  Administered 2015-06-28 – 2015-06-30 (×4): 0.25 ug/kg/min via INTRAVENOUS
  Filled 2015-06-28 (×4): qty 100

## 2015-06-28 MED ORDER — POTASSIUM CHLORIDE ER 10 MEQ PO TBCR
40.0000 meq | EXTENDED_RELEASE_TABLET | Freq: Once | ORAL | Status: AC
  Administered 2015-06-28: 40 meq via ORAL
  Filled 2015-06-28 (×2): qty 4

## 2015-06-28 NOTE — Progress Notes (Signed)
Patient arrived to 3W25 from the [facility]. Patient has no complaints of pain; dyspnea at rest w/ expiratory wheezing and oxygen saturations 88%. Placed patient on [facility]. Patient's weight was obtained in the bed because patient is too weak to stand. Patient had one incontinent episode, so output was not measured. Patient's home milrinone pump was turned off and milrinone was started on hospital IV pump at ordered rate. Will continue to monitor.

## 2015-06-28 NOTE — Progress Notes (Signed)
Advanced Heart Failure Rounding Note   Subjective:    Admitted with weakness and lethargy. Seems better this am. Weight about the same as prior d/c. BNP way down. BUN and Cr climbing slowly    Objective:   Weight Range:  Vital Signs:   Temp:  [97.8 F (36.6 C)-98.8 F (37.1 C)] 98.6 F (37 C) (03/04 0826) Pulse Rate:  [103-109] 106 (03/04 0432) Resp:  [22-29] 29 (03/04 0826) BP: (105-123)/(50-71) 106/61 mmHg (03/04 0826) SpO2:  [91 %-98 %] 93 % (03/04 0826) Weight:  [68.266 kg (150 lb 8 oz)-74.707 kg (164 lb 11.2 oz)] 70.171 kg (154 lb 11.2 oz) (03/04 0131) Last BM Date:  (patient cannot remember)  Weight change: Filed Weights   06/27/15 2041 06/28/15 0018 06/28/15 0131  Weight: 68.266 kg (150 lb 8 oz) 74.707 kg (164 lb 11.2 oz) 70.171 kg (154 lb 11.2 oz)    Intake/Output:   Intake/Output Summary (Last 24 hours) at 06/28/15 1236 Last data filed at 06/28/15 1222  Gross per 24 hour  Intake    580 ml  Output    950 ml  Net   -370 ml     Physical Exam: General: Lying in bed. NAD HEENT: normal Neck: supple. JVP 8-9 . Carotids 2+ bilat; no bruits. No thyromegaly or nodule noted.  Cor: PMI laterally displaced. Tachy regular + loud s3 Lungs: Clear, normal effort Abdomen: soft, NT, ND, no HSM. No bruits or masses. +BS  Extremities: no cyanosis, clubbing, rash, tr-1+ankle edema Neuro: alert & orientedx3, cranial nerves grossly intact. moves all 4 extremities w/o difficulty. Affect pleasant  Telemetry: Reviewed personally, SR/ST 90-100s  Labs: Basic Metabolic Panel:  Recent Labs Lab 06/23/15 0415 06/24/15 0345 06/25/15 0400 06/27/15 2116 06/28/15 0002 06/28/15 0517  NA 129* 128* 129* 126*  --  130*  K 4.0 4.3 3.7 3.5  --  3.3*  CL 77* 77* 78* 76*  --  79*  CO2 38* 39* 39* 33*  --  33*  GLUCOSE 113* 154* 140* 142*  --  119*  BUN 74* 76* 79* 91*  --  93*  CREATININE 2.23* 2.27* 2.14* 2.26* 2.20* 2.35*  CALCIUM 9.1 9.2 9.3 9.4  --  9.4    Liver  Function Tests: No results for input(s): AST, ALT, ALKPHOS, BILITOT, PROT, ALBUMIN in the last 168 hours. No results for input(s): LIPASE, AMYLASE in the last 168 hours. No results for input(s): AMMONIA in the last 168 hours.  CBC:  Recent Labs Lab 06/27/15 2116 06/28/15 0002  WBC 6.1 6.2  HGB 12.4 12.3  HCT 35.6* 35.6*  MCV 79.5 79.6  PLT 267 268    Cardiac Enzymes:  Recent Labs Lab 06/28/15 0002 06/28/15 0517 06/28/15 1121  TROPONINI 0.06* 0.09* 0.08*    BNP: BNP (last 3 results)  Recent Labs  06/09/15 1920 06/20/15 2314 06/28/15 0002  BNP >4500.0* >4500.0* 1267.7*    ProBNP (last 3 results) No results for input(s): PROBNP in the last 8760 hours.    Other results:  Imaging: Dg Chest Portable 1 View  06/27/2015  CLINICAL DATA:  Decreased breath sounds, no fever, history of congestive heart failure and renal disease EXAM: PORTABLE CHEST 1 VIEW COMPARISON:  06/20/2015 FINDINGS: Significant enlargement of the cardiac silhouette stable. Single lead defibrillator unchanged. Right PICC line new from prior study with tip near the cavoatrial junction. No pneumothorax. There is mild to moderate central vascular congestion slightly less prominent when compared to the prior study. No definite pulmonary  edema. IMPRESSION: Cardiac enlargement and vascular congestion but without definite evidence of pulmonary edema. Electronically Signed   By: Esperanza Heir M.D.   On: 06/27/2015 21:36      Medications:     Scheduled Medications: . allopurinol  200 mg Oral Daily  . amiodarone  200 mg Oral BID  . antiseptic oral rinse  7 mL Mouth Rinse BID  . aspirin EC  81 mg Oral Daily  . heparin  5,000 Units Subcutaneous 3 times per day  . isosorbide-hydrALAZINE  1 tablet Oral TID  . potassium chloride SA  20 mEq Oral Daily  . sodium chloride flush  3 mL Intravenous Q12H  . spironolactone  12.5 mg Oral Daily  . torsemide  40 mg Oral BID     Infusions: . milrinone 0.25  mcg/kg/min (06/28/15 0205)     PRN Medications:  sodium chloride, acetaminophen, ipratropium-albuterol, ondansetron (ZOFRAN) IV, sodium chloride flush   Assessment:   1. Acute on chronic systolic CHF 2/2 NICM 3. Acute on CKD IV 4. Severe MR 5. LBBB - 6.Gout 7. Hyponatremia/hypokalemia  Plan/Discussion:    She has end-stage HF and has been admitted again now with weakness and lethargy despite home milrinone. Volume status doesn't seem too bad but marked s3 on exam. Agree with continuing po torsemide and milrinone for now. Will check CVP and co-ox. Will likely need SNF at d/c. Will need to raise the issue of hospice/palliative care again but she and her family have been reluctant to address. I think we have run out of options here. Will also check ammonia  Supp K=.    Length of Stay: 1   Ezio Wieck MD 06/28/2015, 12:36 PM  Advanced Heart Failure Team Pager [phone] (M-F; 7a - 4p)  Please contact [facility] for night-coverage after hours (4p -7a ) and weekends on [email or web address]

## 2015-06-29 DIAGNOSIS — N184 Chronic kidney disease, stage 4 (severe): Secondary | ICD-10-CM

## 2015-06-29 LAB — BASIC METABOLIC PANEL
ANION GAP: 17 — AB (ref 5–15)
BUN: 84 mg/dL — ABNORMAL HIGH (ref 6–20)
CHLORIDE: 79 mmol/L — AB (ref 101–111)
CO2: 34 mmol/L — ABNORMAL HIGH (ref 22–32)
Calcium: 9.2 mg/dL (ref 8.9–10.3)
Creatinine, Ser: 2.38 mg/dL — ABNORMAL HIGH (ref 0.44–1.00)
GFR calc Af Amer: 21 mL/min — ABNORMAL LOW (ref >60)
GFR, EST NON AFRICAN AMERICAN: 18 mL/min — AB (ref >60)
GLUCOSE: 118 mg/dL — AB (ref 65–99)
POTASSIUM: 4 mmol/L (ref 3.5–5.1)
SODIUM: 130 mmol/L — AB (ref 135–145)

## 2015-06-29 LAB — URINALYSIS, ROUTINE W REFLEX MICROSCOPIC
Bilirubin Urine: NEGATIVE
Glucose, UA: NEGATIVE mg/dL
Ketones, ur: NEGATIVE mg/dL
Leukocytes, UA: NEGATIVE
Nitrite: NEGATIVE
Protein, ur: NEGATIVE mg/dL
Specific Gravity, Urine: 1.009 (ref 1.005–1.030)
pH: 7.5 (ref 5.0–8.0)

## 2015-06-29 LAB — CARBOXYHEMOGLOBIN
CARBOXYHEMOGLOBIN: 1.6 % — AB (ref 0.5–1.5)
METHEMOGLOBIN: 0.6 % (ref 0.0–1.5)
O2 Saturation: 76.9 %
TOTAL HEMOGLOBIN: 11.9 g/dL — AB (ref 12.0–16.0)

## 2015-06-29 LAB — CBC
HCT: 34 % — ABNORMAL LOW (ref 36.0–46.0)
Hemoglobin: 11.9 g/dL — ABNORMAL LOW (ref 12.0–15.0)
MCH: 28.2 pg (ref 26.0–34.0)
MCHC: 35 g/dL (ref 30.0–36.0)
MCV: 80.6 fL (ref 78.0–100.0)
Platelets: 257 10*3/uL (ref 150–400)
RBC: 4.22 MIL/uL (ref 3.87–5.11)
RDW: 17.3 % — ABNORMAL HIGH (ref 11.5–15.5)
WBC: 5.4 10*3/uL (ref 4.0–10.5)

## 2015-06-29 LAB — URINE MICROSCOPIC-ADD ON

## 2015-06-29 LAB — AMMONIA: Ammonia: 19 umol/L (ref 9–35)

## 2015-06-29 NOTE — Plan of Care (Signed)
Problem: Activity: Goal: Capacity to carry out activities will improve Outcome: Progressing Patient has been up in the chair during the entire shift. Patient has been able to get up to the bedside commode with a 1 person staff assist several times, and patient was able to stand up on the scale this morning.   Problem: Safety: Goal: Ability to remain free from injury will improve Outcome: Completed/Met Date Met:  06/29/15 Patient's call bell is within reach. Patient understands the need to call for staff assistance before getting up. Patient has been consistently using her call bell and has not made any attempts to get up unassisted. Patient is alert and oriented x4.

## 2015-06-29 NOTE — Progress Notes (Signed)
Patient ID: Michele Caldwell, female   DOB: Nov 30, 1934, 81 y.o.   MRN: [id number]    Advanced Heart Failure Rounding Note   Subjective:    Admitted with weakness and lethargy. She is up in the chair this morning.  Co-ox 77% and CVP 7.  Seems to be doing better, says she feels good.    Objective:   Weight Range:  Vital Signs:   Temp:  [98 F (36.7 C)-98.4 F (36.9 C)] 98.1 F (36.7 C) (03/05 0835) Pulse Rate:  [95-102] 102 (03/05 0835) Resp:  [21-37] 27 (03/05 0835) BP: (92-108)/(49-83) 92/50 mmHg (03/05 0835) SpO2:  [92 %-99 %] 96 % (03/05 0835) Weight:  [151 lb 14.4 oz (68.901 kg)] 151 lb 14.4 oz (68.901 kg) (03/05 0313) Last BM Date:  (patient cannot remember)  Weight change: Filed Weights   06/28/15 0018 06/28/15 0131 06/29/15 0313  Weight: 164 lb 11.2 oz (74.707 kg) 154 lb 11.2 oz (70.171 kg) 151 lb 14.4 oz (68.901 kg)    Intake/Output:   Intake/Output Summary (Last 24 hours) at 06/29/15 0925 Last data filed at 06/29/15 0830  Gross per 24 hour  Intake    360 ml  Output   1425 ml  Net  -1065 ml     Physical Exam: General: NAD HEENT: normal Neck: supple. JVP 7. Carotids 2+ bilat; no bruits. No thyromegaly or nodule noted.  Cor: PMI laterally displaced. Mildly tachy regular + loud s3 Lungs: Clear, normal effort Abdomen: soft, NT, ND, no HSM. No bruits or masses. +BS  Extremities: no cyanosis, clubbing, rash, tr-1+ankle edema Neuro: alert & orientedx3, cranial nerves grossly intact. moves all 4 extremities w/o difficulty. Affect pleasant  Telemetry: Reviewed personally, SR/ST 90-100s  Labs: Basic Metabolic Panel:  Recent Labs Lab 06/24/15 0345 06/25/15 0400 06/27/15 2116 06/28/15 0002 06/28/15 0517 06/29/15 0342  NA 128* 129* 126*  --  130* 130*  K 4.3 3.7 3.5  --  3.3* 4.0  CL 77* 78* 76*  --  79* 79*  CO2 39* 39* 33*  --  33* 34*  GLUCOSE 154* 140* 142*  --  119* 118*  BUN 76* 79* 91*  --  93* 84*  CREATININE 2.27* 2.14* 2.26* 2.20* 2.35* 2.38*    CALCIUM 9.2 9.3 9.4  --  9.4 9.2    Liver Function Tests: No results for input(s): AST, ALT, ALKPHOS, BILITOT, PROT, ALBUMIN in the last 168 hours. No results for input(s): LIPASE, AMYLASE in the last 168 hours.  Recent Labs Lab 06/29/15 0342  AMMONIA 19    CBC:  Recent Labs Lab 06/27/15 2116 06/28/15 0002 06/29/15 0545  WBC 6.1 6.2 5.4  HGB 12.4 12.3 11.9*  HCT 35.6* 35.6* 34.0*  MCV 79.5 79.6 80.6  PLT 267 268 257    Cardiac Enzymes:  Recent Labs Lab 06/28/15 0002 06/28/15 0517 06/28/15 1121  TROPONINI 0.06* 0.09* 0.08*    BNP: BNP (last 3 results)  Recent Labs  06/09/15 1920 06/20/15 2314 06/28/15 0002  BNP >4500.0* >4500.0* 1267.7*    ProBNP (last 3 results) No results for input(s): PROBNP in the last 8760 hours.    Other results:  Imaging: Dg Chest Portable 1 View  06/27/2015  CLINICAL DATA:  Decreased breath sounds, no fever, history of congestive heart failure and renal disease EXAM: PORTABLE CHEST 1 VIEW COMPARISON:  06/20/2015 FINDINGS: Significant enlargement of the cardiac silhouette stable. Single lead defibrillator unchanged. Right PICC line new from prior study with tip near the cavoatrial junction. No pneumothorax.  There is mild to moderate central vascular congestion slightly less prominent when compared to the prior study. No definite pulmonary edema. IMPRESSION: Cardiac enlargement and vascular congestion but without definite evidence of pulmonary edema. Electronically Signed   By: Esperanza Heir M.D.   On: 06/27/2015 21:36     Medications:     Scheduled Medications: . allopurinol  200 mg Oral Daily  . amiodarone  200 mg Oral BID  . antiseptic oral rinse  7 mL Mouth Rinse BID  . aspirin EC  81 mg Oral Daily  . isosorbide-hydrALAZINE  1 tablet Oral TID  . potassium chloride SA  20 mEq Oral Daily  . sodium chloride flush  3 mL Intravenous Q12H  . spironolactone  12.5 mg Oral Daily  . torsemide  40 mg Oral BID     Infusions: . milrinone 0.25 mcg/kg/min (06/28/15 1752)    PRN Medications: sodium chloride, acetaminophen, ipratropium-albuterol, ondansetron (ZOFRAN) IV, sodium chloride flush   Assessment:   1. Acute on chronic systolic CHF 2/2 NICM: Has been on home milrinone.  3. Acute on CKD IV 4. Severe MR 5. LBBB - 6. Gout 7. Hyponatremia/hypokalemia  Plan/Discussion:    She has end-stage HF and has been admitted again now with weakness and lethargy despite home milrinone. CVP 7 today with co-ox 77%.  She does not seem significantly volume overloaded.  Seems more clear today. I am not going to change any medications today, seems stable. Will likely need SNF at discharge, will need PT evaluation. Will need to raise the issue of hospice/palliative care again but she and her family have been reluctant to address.   Length of Stay: 2   Marca Ancona MD 06/29/2015, 9:25 AM  Advanced Heart Failure Team Pager [phone] (M-F; 7a - 4p)  Please contact [facility] for night-coverage after hours (4p -7a ) and weekends on [email or web address]

## 2015-06-29 NOTE — Progress Notes (Signed)
Pharmacist Heart Failure Core Measure Documentation  Assessment: Michele Caldwell has an EF documented as 10-15% on 06/28/15 by echo.  Rationale: Heart failure patients with left ventricular systolic dysfunction (LVSD) and an EF < 40% should be prescribed an angiotensin converting enzyme inhibitor (ACEI) or angiotensin receptor blocker (ARB) at discharge unless a contraindication is documented in the medical record.  This patient is not currently on an ACEI or ARB for HF.  This note is being placed in the record in order to provide documentation that a contraindication to the use of these agents is present for this encounter.  ACE Inhibitor or Angiotensin Receptor Blocker is contraindicated (specify all that apply)  []   ACEI allergy AND ARB allergy []   Angioedema []   Moderate or severe aortic stenosis []   Hyperkalemia []   Hypotension []   Renal artery stenosis [x]   Worsening renal function, preexisting renal disease or dysfunction  Precious Gilchrest L. Roseanne Reno, PharmD PGY2 Infectious Diseases Pharmacy Resident Pager: [phone] 06/29/2015 2:52 PM

## 2015-06-29 NOTE — Progress Notes (Signed)
Utilization Review Completed.Michele Caldwell T3/08/2015  

## 2015-06-29 NOTE — Progress Notes (Signed)
Paged Dr. Alla German on call for cardiology related to patient having 125cc of blood tinged urine with bloody sediment. Orders received for UA and CBC.  Of note, at 2200 patient bled from the site on her abdomen where 2200 dose of subcutaneous heparin was given. Patient's bleeding was significant enough to soak one side of her gown. Per patient's granddaughter, this happened during the patient's recent hospital admission every time she received a subcutaneous heparin injection.

## 2015-06-29 NOTE — Progress Notes (Signed)
[facility]  Patient Status: Active pt with [facility] prior to this readmission.  [facility] is providing the following services: [facility] for home Milrinone therapy.  [facility] is providing patients Home Health nursing for HF management.   [facility] hospital team will follow Ms. Brunet while an inpatient to support the HF team, pt and family at DC based on orders.  If patient discharges after hours, please call [phone].   Sedalia Muta 06/29/2015, 10:08 PM

## 2015-06-30 DIAGNOSIS — Z515 Encounter for palliative care: Secondary | ICD-10-CM

## 2015-06-30 LAB — CBC
HEMATOCRIT: 32.2 % — AB (ref 36.0–46.0)
Hemoglobin: 11.2 g/dL — ABNORMAL LOW (ref 12.0–15.0)
MCH: 28.1 pg (ref 26.0–34.0)
MCHC: 34.8 g/dL (ref 30.0–36.0)
MCV: 80.9 fL (ref 78.0–100.0)
Platelets: 247 10*3/uL (ref 150–400)
RBC: 3.98 MIL/uL (ref 3.87–5.11)
RDW: 17.3 % — AB (ref 11.5–15.5)
WBC: 5.8 10*3/uL (ref 4.0–10.5)

## 2015-06-30 LAB — BASIC METABOLIC PANEL
Anion gap: 17 — ABNORMAL HIGH (ref 5–15)
BUN: 82 mg/dL — AB (ref 6–20)
CHLORIDE: 80 mmol/L — AB (ref 101–111)
CO2: 33 mmol/L — ABNORMAL HIGH (ref 22–32)
CREATININE: 2.56 mg/dL — AB (ref 0.44–1.00)
Calcium: 9.1 mg/dL (ref 8.9–10.3)
GFR calc Af Amer: 20 mL/min — ABNORMAL LOW (ref >60)
GFR calc non Af Amer: 17 mL/min — ABNORMAL LOW (ref >60)
Glucose, Bld: 105 mg/dL — ABNORMAL HIGH (ref 65–99)
Potassium: 4 mmol/L (ref 3.5–5.1)
SODIUM: 130 mmol/L — AB (ref 135–145)

## 2015-06-30 LAB — CARBOXYHEMOGLOBIN
CARBOXYHEMOGLOBIN: 1.2 % (ref 0.5–1.5)
Carboxyhemoglobin: 1.8 % — ABNORMAL HIGH (ref 0.5–1.5)
Methemoglobin: 0.7 % (ref 0.0–1.5)
Methemoglobin: 0.8 % (ref 0.0–1.5)
O2 SAT: 66.4 %
O2 Saturation: 90.7 %
TOTAL HEMOGLOBIN: 11.4 g/dL — AB (ref 12.0–16.0)
Total hemoglobin: 11.7 g/dL — ABNORMAL LOW (ref 12.0–16.0)

## 2015-06-30 LAB — MAGNESIUM: Magnesium: 1.7 mg/dL (ref 1.7–2.4)

## 2015-06-30 MED ORDER — FENTANYL CITRATE (PF) 100 MCG/2ML IJ SOLN: 12.5000 ug | INTRAMUSCULAR | Status: DC | PRN

## 2015-06-30 MED ORDER — GLYCOPYRROLATE 0.2 MG/ML IJ SOLN
0.2000 mg | INTRAMUSCULAR | Status: DC | PRN
  Administered 2015-07-02: 0.2 mg via INTRAVENOUS
  Filled 2015-06-30 (×3): qty 1

## 2015-06-30 NOTE — Progress Notes (Addendum)
Patient had 12 beats of Vtach. Paged Dr. Zachery Conch on call for cardiology; orders for magnesium level to be added to morning labs. Patient asymptomatic. Will continue to monitor.

## 2015-06-30 NOTE — Care Management Important Message (Signed)
Important Message  Patient Details  Name: Michele Caldwell MRN: [id number] Date of Birth: 08-17-1934   Medicare Important Message Given:  Yes    [name] 06/30/2015, 2:31 PM

## 2015-06-30 NOTE — Progress Notes (Signed)
Inpatient Rehabilitation  Request received from PT for screen for possible IP Rehab.  Per Maxine Glenn, PA with Heart Failure, pt. with likely very short term prognosis.  Palliative consult has been ordered.  At this time, we are not recommending IP Rehab consult. Could consider IP Rehab consult if pt's status improves.  Please call if questions.  Weldon Picking PT Inpatient Rehab Admissions Coordinator Cell [phone] Office [phone]

## 2015-06-30 NOTE — Consult Note (Signed)
Consultation Note Date: 06/30/2015   Patient Name: Michele Caldwell  DOB: Apr 02, 1935  MRN: [id number]  Age / Sex: 81 y.o., female  PCP: No Pcp Per Patient Referring Physician: Skeet Latch, MD  Reason for Consultation: Establishing goals of care  Michele Caldwell is a 81 yo female with  chronic systolic and diastolic heart failure on milrinone, LBBB, s/p ICD, severe MR, hypertension, pulmonary hypertensin, CKD 3 recently discharged twice with cardiogenic shock back with acute on chronic systolic heart failure.   Clinical Assessment/Narrative: I met today with [name] and her granddaughter, Seward Grater, at bedside. Seward Grater has been her caretaker and is also Therapist, sports and has good understanding of poor prognosis. Michele Caldwell has no complaints and is very sleepy and arouses and smiles telling us "I love Jesus." Seward Grater says that Michele Caldwell told her that Jesus is coming for her real soon. We believe that Michele Caldwell has peace and is ready to die when her time comes. DNR confirmed and also confirmed that we should deactivate ICD at this time as well as this would cause her pain at EOL.   When I ask Michele Caldwell who helps her make decisions she tells me "God" but then when I ask which family member helps she tells me "[name]." They are interested in pursuing HCPOA.   Long discussion regarding comfort care and EOL. Seward Grater is mostly concerned about her grandmother not suffering ([name] also raised her as her own child). Discussed hospice facility and how this may be beneficial as well. I will continue to follow for support and to minimize suffering.    Contacts/Participants in Discussion: Primary Decision Maker: Granddaughter [name]   HCPOA: no - none formal Seward Grater is his caretaker   SUMMARY OF RECOMMENDATIONS - DNR - Focus on comfort care - ICD to be deactivated - Added prn meds for dyspnea/secretions  Code Status/Advance Care  Planning: DNR    Code Status Orders        Start     Ordered   06/30/15 1250  Do not attempt resuscitation (DNR)   Continuous    Question Answer Comment  In the event of cardiac or respiratory ARREST Do not call a "code blue"   In the event of cardiac or respiratory ARREST Do not perform Intubation, CPR, defibrillation or ACLS   In the event of cardiac or respiratory ARREST Use medication by any route, position, wound care, and other measures to relive pain and suffering. May use oxygen, suction and manual treatment of airway obstruction as needed for comfort.      06/30/15 1249    Code Status History    Date Active Date Inactive Code Status Order ID Comments User Context   06/27/2015 11:59 PM 06/30/2015 12:49 PM Full Code [id number]  Skeet Latch, MD ED   06/20/2015 10:54 PM 06/25/2015  8:24 PM Full Code [id number]  Flossie Dibble, MD Inpatient   06/09/2015  6:50 PM 06/18/2015  8:47 PM Full Code [id number]  Liliane Shi, PA-C Inpatient       Symptom Management:   Dyspnea/SOB: Fentanyl 12.5-25 mcg every 2 hours prn.   Secretions/gurgling: Robinul 0.2 mg every 4 hours prn.   Palliative Prophylaxis:   Aspiration, Bowel Regimen, Frequent Pain Assessment and Oral Care  Additional Recommendations (Limitations, Scope, Preferences):  Full Comfort Care  Psycho-social/Spiritual:  Support System: Strong Desire for further Chaplaincy support:yes Additional Recommendations: Caregiving  Support/Resources, Education on Hospice and Grief/Bereavement Support  Prognosis: Most likely days.   Discharge Planning: [facility]  death vs hospice facility.    Chief Complaint/ Primary Diagnoses: Present on Admission:  . Automatic implantable cardioverter-defibrillator in situ . Hypertensive heart disease . Severe mitral regurgitation . Moderate tricuspid regurgitation . CKD (chronic kidney disease), stage III  I have reviewed the medical record, interviewed the patient and family, and  examined the patient. The following aspects are pertinent.  Past Medical History  Diagnosis Date  . Chronic systolic CHF (congestive heart failure) ([place])     a. 06/2006 EF 20%; b. 04/2007 EF 15%; c. 01/2010 EF 15%; d, 05/2015 Echo: EF 20%, diff HK, inf, infsept, post AK, sev dil LV w/ mild LVH, sev MR, sev dil LA, mildly reduced RV fxn, mildly dil RA, mod TR, PASP 61mHg.  .Marland KitchenNICM (nonischemic cardiomyopathy) ([place]     a. 06/2006 Cath: nl cors;  b. 05/2007 BSX EO756Confient Single Lead AICD; c. 05/2015 Echo: EF 15-20%.  . Severe mitral regurgitation     a. 05/2015 Echo: Severe MR.  . CKD (chronic kidney disease), stage III   . Moderate tricuspid regurgitation   . PAH [place] artery hypertension) [place]     a. 05/2015 Echo: PASP 531mg.  . Marland KitchenICD (automatic cardioverter/defibrillator) present     a. 05/2007 BSX E030 Confient Single Lead AICD.  . Marland Kitchenypertensive heart disease    Social History   Social History  . Marital Status: Single    Spouse Name: N/A  . Number of Children: N/A  . Years of Education: N/A   Occupational History  . Retired    Social History Main Topics  . Smoking status: Never Smoker   . Smokeless tobacco: Never Used  . Alcohol Use: No  . Drug Use: No  . Sexual Activity: Not Asked   Other Topics Concern  . None   Social History Narrative   Family History  Problem Relation Age of Onset  . Cancer    . Heart failure    . Hypertension Mother   . Heart disease Mother   . Heart failure Mother   . Kidney disease Mother   . Cancer Father     BACK  . Heart attack Mother    Scheduled Meds: . allopurinol  200 mg Oral Daily  . amiodarone  200 mg Oral BID  . antiseptic oral rinse  7 mL Mouth Rinse BID  . aspirin EC  81 mg Oral Daily  . isosorbide-hydrALAZINE  1 tablet Oral TID  . potassium chloride SA  20 mEq Oral Daily  . sodium chloride flush  3 mL Intravenous Q12H  . spironolactone  12.5 mg Oral Daily  . torsemide  40 mg Oral BID   Continuous Infusions:  PRN  Meds:.sodium chloride, acetaminophen, ipratropium-albuterol, ondansetron (ZOFRAN) IV, sodium chloride flush Medications Prior to Admission:  Prior to Admission medications   Medication Sig Start Date End Date Taking? Authorizing Provider  allopurinol (ZYLOPRIM) 100 MG tablet Take 2 tablets (200 mg total) by mouth daily. 06/18/15  Yes Amy D Clegg, NP  amiodarone (PACERONE) 200 MG tablet Take 1 tablet (200 mg total) by mouth 2 (two) times daily. 06/25/15  Yes MiShirley FriarPA-C  aspirin 81 MG tablet Take 1 tablet (81 mg total) by mouth daily. 06/25/15  Yes MiSatira Mccallumillery, PA-C  isosorbide-hydrALAZINE (BIDIL) 20-37.5 MG tablet Take 1 tablet by mouth 3 (three) times daily. 06/18/15  Yes Amy D Clegg, NP  milrinone (PRIMACOR) 20 MG/100ML SOLN infusion Inject 18.55 mcg/min into the vein continuous. 06/25/15  Yes MiSatira Mccallum  Tillery, PA-C  potassium chloride 20 MEQ TBCR Take 20 mEq by mouth daily. 06/25/15  Yes Shirley Friar, PA-C  spironolactone (ALDACTONE) 25 MG tablet Take 0.5 tablets (12.5 mg total) by mouth daily. 06/25/15  Yes Shirley Friar, PA-C  torsemide (DEMADEX) 20 MG tablet Take 2 tablets (40 mg total) by mouth 2 (two) times daily. 06/26/15  Yes Jolaine Artist, MD   Allergies  Allergen Reactions  . Ace Inhibitors Other (See Comments) and Cough    Also reported angioedema    Review of Systems  Unable to perform ROS   Physical Exam  Constitutional: She appears well-developed. She appears lethargic.  HENT:  Head: Normocephalic and atraumatic.  Cardiovascular: Normal rate.   Respiratory: Effort normal. No accessory muscle usage. No tachypnea. No respiratory distress.  GI: Normal appearance.  Neurological: She appears lethargic.    Vital Signs: BP 109/57 mmHg  Pulse 89  Temp(Src) 97.7 F (36.5 C) (Oral)  Resp 19  Ht 5' 6"  (1.676 m)  Wt 68.992 kg (152 lb 1.6 oz)  BMI 24.56 kg/m2  SpO2 98%  SpO2: SpO2: 98 % O2 Device:SpO2: 98 % O2 Flow Rate: .O2  Flow Rate (L/min): 2 L/min  IO: Intake/output summary:  Intake/Output Summary (Last 24 hours) at 06/30/15 1437 Last data filed at 06/30/15 1326  Gross per 24 hour  Intake    440 ml  Output   1350 ml  Net   -910 ml    LBM: Last BM Date:  (patient cannot remember) Baseline Weight: Weight: 68.266 kg (150 lb 8 oz) Most recent weight: Weight: 68.992 kg (152 lb 1.6 oz)      Palliative Assessment/Data:  Flowsheet Rows        Most Recent Value   Intake Tab    Referral Department  Cardiology   Unit at Time of Referral  Cardiac/Telemetry Unit   Palliative Care Primary Diagnosis  Cardiac   Date Notified  06/30/15   Palliative Care Type  New Palliative care   Reason for referral  Clarify Goals of Care   Date of Admission  06/27/15   # of days IP prior to Palliative referral  3   Clinical Assessment    Psychosocial & Spiritual Assessment    Palliative Care Outcomes       Additional Data Reviewed:  CBC:    Component Value Date/Time   WBC 5.8 06/30/2015 0420   HGB 11.2* 06/30/2015 0420   HCT 32.2* 06/30/2015 0420   PLT 247 06/30/2015 0420   MCV 80.9 06/30/2015 0420   NEUTROABS 3.1 06/09/2015 1919   LYMPHSABS 1.1 06/09/2015 1919   MONOABS 0.4 06/09/2015 1919   EOSABS 0.1 06/09/2015 1919   BASOSABS 0.0 06/09/2015 1919   Comprehensive Metabolic Panel:    Component Value Date/Time   NA 130* 06/30/2015 0420   K 4.0 06/30/2015 0420   CL 80* 06/30/2015 0420   CO2 33* 06/30/2015 0420   BUN 82* 06/30/2015 0420   CREATININE 2.56* 06/30/2015 0420   CREATININE 1.65* 05/23/2015 1632   GLUCOSE 105* 06/30/2015 0420   CALCIUM 9.1 06/30/2015 0420   AST 40 06/20/2015 2314   ALT 19 06/20/2015 2314   ALKPHOS 51 06/20/2015 2314   BILITOT 3.9* 06/20/2015 2314   PROT 7.3 06/20/2015 2314   ALBUMIN 3.0* 06/20/2015 2314     Time In: 1330 Time Out: 8088 Time Total: 44mn Greater than 50%  of this time was spent counseling and coordinating care related to the above assessment and  plan.  Signed by: Pershing Proud, NP  Pershing Proud, NP  8/0/1655, 2:37 PM  Please contact Palliative Medicine Team phone at [phone] for questions and concerns.

## 2015-06-30 NOTE — Progress Notes (Signed)
Patient ID: Michele Caldwell, female   DOB: [birth year], 81 y.o.   MRN: [id number]    Advanced Heart Failure Rounding Note   Subjective:    Admitted with weakness and lethargy despite milrinone.   Creatinine up slightly.  Coox 90.7%, will recheck. Slightly confused.  Daughter says she understands patients poor prognosis and that she will likely not come off milrinone. She is OK with palliative consult.   Objective:   Weight Range:  Vital Signs:   Temp:  [97.4 F (36.3 C)-98.4 F (36.9 C)] 98.4 F (36.9 C) (03/06 0432) Pulse Rate:  [86-104] 104 (03/06 0432) Resp:  [22-29] 26 (03/06 0432) BP: (92-110)/(48-60) 98/48 mmHg (03/06 0432) SpO2:  [92 %-98 %] 97 % (03/06 0432) Weight:  [152 lb 1.6 oz (68.992 kg)] 152 lb 1.6 oz (68.992 kg) (03/06 0239) Last BM Date:  (patient cannot remember)  Weight change: Filed Weights   06/28/15 0131 06/29/15 0313 06/30/15 0239  Weight: 154 lb 11.2 oz (70.171 kg) 151 lb 14.4 oz (68.901 kg) 152 lb 1.6 oz (68.992 kg)    Intake/Output:   Intake/Output Summary (Last 24 hours) at 06/30/15 0830 Last data filed at 06/30/15 0546  Gross per 24 hour  Intake 920.68 ml  Output   1225 ml  Net -304.32 ml     Physical Exam: General: NAD HEENT: normal Neck: supple. JVP 6-7. Carotids 2+ bilat; no bruits. No thyromegaly or nodule noted.  Cor: PMI laterally displaced. Mildly tachy regular + loud S3 Lungs: Clear, normal effort Abdomen: soft, NT, ND, no HSM. No bruits or masses. +BS  Extremities: no cyanosis, clubbing, rash, tr ankle edema Neuro: Slow to respond to questions, alert to person and place,  cranial nerves grossly intact. moves all 4 extremities w/o difficulty. Affect pleasant  Telemetry: Reviewed personally, SR/ST 90-100s  Labs: Basic Metabolic Panel:  Recent Labs Lab 06/25/15 0400 06/27/15 2116 06/28/15 0002 06/28/15 0517 06/29/15 0342 06/30/15 0420  NA 129* 126*  --  130* 130* 130*  K 3.7 3.5  --  3.3* 4.0 4.0  CL 78* 76*  --  79*  79* 80*  CO2 39* 33*  --  33* 34* 33*  GLUCOSE 140* 142*  --  119* 118* 105*  BUN 79* 91*  --  93* 84* 82*  CREATININE 2.14* 2.26* 2.20* 2.35* 2.38* 2.56*  CALCIUM 9.3 9.4  --  9.4 9.2 9.1  MG  --   --   --   --   --  1.7    Liver Function Tests: No results for input(s): AST, ALT, ALKPHOS, BILITOT, PROT, ALBUMIN in the last 168 hours. No results for input(s): LIPASE, AMYLASE in the last 168 hours.  Recent Labs Lab 06/29/15 0342  AMMONIA 19    CBC:  Recent Labs Lab 06/27/15 2116 06/28/15 0002 06/29/15 0545 06/30/15 0420  WBC 6.1 6.2 5.4 5.8  HGB 12.4 12.3 11.9* 11.2*  HCT 35.6* 35.6* 34.0* 32.2*  MCV 79.5 79.6 80.6 80.9  PLT 267 268 257 247    Cardiac Enzymes:  Recent Labs Lab 06/28/15 0002 06/28/15 0517 06/28/15 1121  TROPONINI 0.06* 0.09* 0.08*    BNP: BNP (last 3 results)  Recent Labs  06/09/15 1920 06/20/15 2314 06/28/15 0002  BNP >4500.0* >4500.0* 1267.7*    ProBNP (last 3 results) No results for input(s): PROBNP in the last 8760 hours.    Other results:  Imaging: No results found.   Medications:     Scheduled Medications: . allopurinol  200 mg Oral  Daily  . amiodarone  200 mg Oral BID  . antiseptic oral rinse  7 mL Mouth Rinse BID  . aspirin EC  81 mg Oral Daily  . isosorbide-hydrALAZINE  1 tablet Oral TID  . potassium chloride SA  20 mEq Oral Daily  . sodium chloride flush  3 mL Intravenous Q12H  . spironolactone  12.5 mg Oral Daily  . torsemide  40 mg Oral BID    Infusions: . milrinone 0.25 mcg/kg/min (06/30/15 0421)    PRN Medications: sodium chloride, acetaminophen, ipratropium-albuterol, ondansetron (ZOFRAN) IV, sodium chloride flush   Assessment:   1. Acute on chronic systolic CHF 2/2 NICM: Has been on home milrinone.  3. Acute on CKD IV 4. Severe MR 5. LBBB - 6. Gout 7. Hyponatremia/hypokalemia  Plan/Discussion:    This is her third admission in 30 days. Despite inotropic support, she presented with  weakness and lethargy. CVP 5-6 today with co-ox 90% (will recheck).    Volume stats appears stable on exam. Remains mildly confused.   Will likely need SNF at discharge. Awaiting PT evaluation.   Spoke with daughter who is ok with palliative consult.  Will place order. Suspect that Miss Analiya has a very short term prognosis with her presentation.   Length of Stay: 3   Graciella Freer PA-C 06/30/2015, 8:30 AM  Advanced Heart Failure Team Pager [phone] (M-F; 7a - 4p)  Please contact [facility] for night-coverage after hours (4p -7a ) and weekends on [email or web address]  Patient seen and examined with Otilio Saber, PA-C. We discussed all aspects of the encounter. I agree with the assessment and plan as stated above.   She remains confused today. Unable to full participate in discussion. Long discussion with her granddaughter today about options. She has end-stage HF and I feel as if she has failed inotropic support. I think the best plan is to stop milrinone in house. If symptomatically stable would transfer to [facility] for [facility]. If develops abrupt symptoms they may need in hospital comfort care. We have changed Code Status to DNR. Palliative Care consult placed.   Kassity Woodson,MD 12:48 PM

## 2015-06-30 NOTE — Progress Notes (Signed)
CARDIAC REHAB PHASE I   PRE:  Rate/Rhythm: 96 SR  BP:  Sitting: 100/59        SaO2: 99 1.5  MODE:  Ambulation: 32 ft   POST:  Rate/Rhythm: 97 SR  BP:  Sitting: 97/55         SaO2: 96 2L  Pt in bed, very slow to move, very flat affect, required assistance to seated position. Pt seemed reluctant to walk this morning. Pt granddaughter at bedside, states she thinks that the pt may be confused this morning, pt slow to respond to questions, however, pt able to follow commands and answers questions appropriately. Pt ambulated 32 ft on 2L O2, IV, rolling walker, gait belt, assist x2, very slow, mildly  unsteady gait, tolerated fair. Pt c/o fatigue, requested to return to room. VSS. Pt to recliner after walk, call bell within reach. Will follow as assist x1.  [id number] Joylene Grapes, RN, BSN 06/30/2015 8:37 AM

## 2015-06-30 NOTE — Evaluation (Signed)
Physical Therapy Evaluation Patient Details Name: Michele Caldwell MRN: [id number] DOB: 12-Mar-1935 Today's Date: 06/30/2015   History of Present Illness  Pt is a 81 y/o F w/ chronic systolic and diastolic heart failure, LBB, s/p ICD, severe MR, pulmonary hypertension, CKD 3, recently discharged twice w/ cardiogenic shock.  Pt was recently admitted 2/24-3/1 w/ heart failure. Pt presents w/ encphalopathy and weakness.    Clinical Impression  Pt admitted with above diagnosis. Pt currently with functional limitations due to the deficits listed below (see PT Problem List). Ms. Mosher currently require mod assist for sit>stand and min assist managing RW in hallway w/ decreased gait speed.  Her granddaughter, whom she lives w/, is very supportive.  Recommending CIR to achieve min assist level for all aspects of mobility to increase functional independence and decrease burden of care.      Follow Up Recommendations CIR;Supervision/Assistance - 24 hour    Equipment Recommendations  Wheelchair (measurements PT);Wheelchair cushion (measurements PT)    Recommendations for Other Services OT consult;Rehab consult     Precautions / Restrictions Precautions Precautions: Fall Restrictions Weight Bearing Restrictions: No      Mobility  Bed Mobility               General bed mobility comments: Pt sitting in recliner chair upon PT arrival  Transfers Overall transfer level: Needs assistance Equipment used: Rolling walker (2 wheeled) Transfers: Sit to/from Stand Sit to Stand: Mod assist         General transfer comment: Verbal cues for hand placement and to scoot to edge of seat prior to standing.  Mod assist to boost up to standing.  Min assist for controlled descent to chair w/ tactile cues for proper hand placement.  Ambulation/Gait Ambulation/Gait assistance: Min assist Ambulation Distance (Feet): 90 Feet Assistive device: Rolling walker (2 wheeled) Gait Pattern/deviations: Decreased  stride length;Trunk flexed   Gait velocity interpretation: <1.8 ft/sec, indicative of risk for recurrent falls General Gait Details: Assist managing RW around obstacles in hallway and cues for forward gaze and upright posture.  SpO2 remains at or above 93% on 2 L O2.    Stairs            Wheelchair Mobility    Modified Rankin (Stroke Patients Only)       Balance Overall balance assessment: Needs assistance Sitting-balance support: Bilateral upper extremity supported;Feet supported Sitting balance-Leahy Scale: Good     Standing balance support: Bilateral upper extremity supported;During functional activity Standing balance-Leahy Scale: Fair Standing balance comment: RW for support                             Pertinent Vitals/Pain Pain Assessment: No/denies pain    Home Living Family/patient expects to be discharged to:: Private residence Living Arrangements: Other relatives Engineer, building services and two great grandchildren) Available Help at Discharge: Family;Available 24 hours/day Type of Home: House Home Access: Stairs to enter Entrance Stairs-Rails: Can reach both;Right;Left Entrance Stairs-Number of Steps: 2 Home Layout: One level Home Equipment: Walker - 2 wheels      Prior Function Level of Independence: Needs assistance   Gait / Transfers Assistance Needed: Grandaughter always assists pt w/ transfer and ambulation due to genearlized weakness.  Pt uses RW for ambulation.  ADL's / Homemaking Assistance Needed: Grandaughter assists w/ bathing, dressing.        Hand Dominance        Extremity/Trunk Assessment   Upper Extremity Assessment: Defer to OT  evaluation           Lower Extremity Assessment: Generalized weakness;RLE deficits/detail;LLE deficits/detail RLE Deficits / Details: strength grossly 3/5 LLE Deficits / Details: strength grossly 3/5  Cervical / Trunk Assessment: Kyphotic  Communication   Communication: No difficulties  (although does not talk much throughout session)  Cognition Arousal/Alertness: Awake/alert Behavior During Therapy: Flat affect Overall Cognitive Status: Impaired/Different from baseline Area of Impairment: Attention;Memory;Safety/judgement;Following commands;Problem solving;Awareness   Current Attention Level: Sustained Memory: Decreased short-term memory Following Commands: Follows one step commands with increased time Safety/Judgement: Decreased awareness of safety;Decreased awareness of deficits Awareness: Emergent Problem Solving: Slow processing;Decreased initiation;Difficulty sequencing;Requires verbal cues General Comments: Pt requires increased time w/ all commands during transfers and ambulation    General Comments      Exercises        Assessment/Plan    PT Assessment Patient needs continued PT services  PT Diagnosis Generalized weakness   PT Problem List Decreased strength;Decreased activity tolerance;Decreased balance;Decreased mobility;Decreased knowledge of use of DME;Decreased safety awareness;Decreased cognition;Cardiopulmonary status limiting activity  PT Treatment Interventions DME instruction;Gait training;Functional mobility training;Stair training;Therapeutic activities;Therapeutic exercise;Balance training;Cognitive remediation;Patient/family education;Wheelchair mobility training   PT Goals (Current goals can be found in the Care Plan section) Acute Rehab PT Goals Patient Stated Goal: none stated; per grandaughter, pt refuses to go to SNF and would like additional rehab PT Goal Formulation: With patient/family Time For Goal Achievement: 07/18/15 Potential to Achieve Goals: Good    Frequency Min 3X/week   Barriers to discharge Inaccessible home environment steps to enter home    Co-evaluation               End of Session Equipment Utilized During Treatment: Gait belt;Oxygen Activity Tolerance: Patient limited by fatigue Patient left: in  chair;with call bell/phone within reach;with family/visitor present Nurse Communication: Mobility status         Time: 7414-2395 PT Time Calculation (min) (ACUTE ONLY): 33 min   Charges:   PT Evaluation $PT Eval Moderate Complexity: 1 Procedure PT Treatments $Gait Training: 8-22 mins   PT G Codes:       Encarnacion Chu PT, DPT  Pager: [phone] Phone: [phone] 06/30/2015, 10:59 AM

## 2015-06-30 NOTE — Progress Notes (Signed)
Palliative:  Full note to follow. I met today with Ms. Mancebo and her granddaughter, Seward Grater, at bedside. Seward Grater has been her caretaker and is also Therapist, sports and has good understanding of poor prognosis. Ms. Quesenberry has no complaints and is very sleepy and arouses and smiles telling us "I love Jesus." Seward Grater says that Ms. Harrel told her that Jesus is coming for her real soon. We believe that Ms. Borden has peace and is ready to die when her time comes. DNR confirmed and also confirmed that we should deactivate ICD at this time as well as this would cause her pain at EOL.   When I ask Ms. Ertle who helps her make decisions she tells me "God" but then when I ask which family member helps she tells me "[name]." They are interested in pursuing HCPOA.   Long discussion regarding comfort care and EOL. Seward Grater is mostly concerned about her grandmother not suffering (Ms. Deller also raised her as her own child). Discussed hospice facility and how this may be beneficial as well. I will continue to follow for support and to minimize suffering.   Vinie Sill, NP Palliative Medicine Team Pager # [phone] (M-F 8a-5p) Team Phone # [phone] (Nights/Weekends)

## 2015-07-01 DIAGNOSIS — R570 Cardiogenic shock: Secondary | ICD-10-CM

## 2015-07-01 DIAGNOSIS — Z515 Encounter for palliative care: Secondary | ICD-10-CM | POA: Insufficient documentation

## 2015-07-01 LAB — BASIC METABOLIC PANEL
Anion gap: 13 (ref 5–15)
BUN: 69 mg/dL — ABNORMAL HIGH (ref 6–20)
CALCIUM: 9.2 mg/dL (ref 8.9–10.3)
CO2: 34 mmol/L — AB (ref 22–32)
CREATININE: 2.44 mg/dL — AB (ref 0.44–1.00)
Chloride: 83 mmol/L — ABNORMAL LOW (ref 101–111)
GFR, EST AFRICAN AMERICAN: 21 mL/min — AB (ref >60)
GFR, EST NON AFRICAN AMERICAN: 18 mL/min — AB (ref >60)
Glucose, Bld: 102 mg/dL — ABNORMAL HIGH (ref 65–99)
Potassium: 3.9 mmol/L (ref 3.5–5.1)
Sodium: 130 mmol/L — ABNORMAL LOW (ref 135–145)

## 2015-07-01 MED ORDER — LORAZEPAM 2 MG/ML PO CONC: 0.5000 mg | Freq: Four times a day (QID) | ORAL | Status: DC | PRN

## 2015-07-01 MED ORDER — LORAZEPAM 0.5 MG PO TABS: 0.5000 mg | ORAL_TABLET | Freq: Four times a day (QID) | ORAL | Status: DC | PRN

## 2015-07-01 MED ORDER — SENNOSIDES-DOCUSATE SODIUM 8.6-50 MG PO TABS
1.0000 | ORAL_TABLET | Freq: Every day | ORAL | Status: DC | PRN
  Administered 2015-07-01: 2 via ORAL
  Filled 2015-07-01 (×2): qty 2

## 2015-07-01 MED ORDER — LORAZEPAM 2 MG/ML PO CONC
0.5000 mg | Freq: Four times a day (QID) | ORAL | Status: DC | PRN
  Filled 2015-07-01: qty 0.25

## 2015-07-01 NOTE — Clinical Social Work Note (Signed)
Clinical Social Work Assessment  Patient Details  Name: Michele Caldwell MRN: [id number] Date of Birth: 1935-01-07  Date of referral:  07/01/15               Reason for consult:  Facility Placement, Discharge Planning                Permission sought to share information with:  Family Supports, Customer service manager Permission granted to share information::  Yes, Verbal Permission Granted  Name::     Helmut Muster  Agency::  [employer]  Relationship::  Development worker, international aid Information:  [phone]  Housing/Transportation Living arrangements for the past 2 months:  [place] of Information:  Other (Comment Required) (Granddaughter) Patient Interpreter Needed:  None Criminal Activity/Legal Involvement Pertinent to Current Situation/Hospitalization:  No - Comment as needed Significant Relationships:  Adult Children, Other Family Members, Other(Comment) (Granddaughter) Lives with:  Self, Relatives Do you feel safe going back to the place where you live?  Yes Need for family participation in patient care:  Yes (Comment)  Care giving concerns:  Patient has been seen by [facility]. Pallative has reccommended residential hospice.   Social Worker assessment / plan:  BSW intern met with patient and patient family at bedside to complete assessment. BSW intern completed assessment with patient granddaughter. Patient granddaughter stated that their first choice was [facility] in [place]. BSW intern contacted [facility] does not currently have a residential hospice. Patient family's next choice was [facility]. BSW intern contacted the admissions coordinator. [facility] is sending a representative to the hospital to evaluate the patient. If the patient is given a bed offer and accepts the bed offer, [facility] will be able to admit the patient around 7pm this evening. CSW will continue to follow and assist  with disposition needs.  Employment status:  Retired Forensic scientist:  Medicare PT Recommendations:  No Follow Up Information / Referral to community resources:   (Residential Hospice)  Patient/Family's Response to care:  Patient and patient family appears to be very thankful for the help they have received during this time. The family appears to be happy with the care the patient is receiving.   Patient/Family's Understanding of and Emotional Response to Diagnosis, Current Treatment, and Prognosis:  Patient family seem to understand the reason for admission, current treatment, and post discharge needs.   Emotional Assessment Appearance:  Appears stated age Attitude/Demeanor/Rapport:  Unable to Assess Affect (typically observed):  Appropriate, Quiet Orientation:  Oriented to Self, Oriented to Place, Oriented to  Time, Oriented to Situation Alcohol / Substance use:  Not Applicable Psych involvement (Current and /or in the community):  No (Comment)  Discharge Needs  Concerns to be addressed:  No discharge needs identified Readmission within the last 30 days:  Yes Current discharge risk:  None Barriers to Discharge:  No Barriers Identified   [place] Intern, [phone]

## 2015-07-01 NOTE — Progress Notes (Signed)
Patient currently snacking on Cheetos brought in by family and also ate 100% of okra that family brought in tonight.

## 2015-07-01 NOTE — Progress Notes (Signed)
   07/01/15 0900  Clinical Encounter Type  Visited With Health care provider  Visit Type Other (Comment) (ADV DIR)  Referral From Palliative care team  [facility] met with Palliative Care Team; Pt currently does not have mental capacity to complete AD/HCPOA per PCT.  9:51 AM Gwynn Burly

## 2015-07-01 NOTE — Care Management Note (Signed)
Case Management Note  Patient Details  Name: Michele Caldwell MRN: [id number] Date of Birth: 07/24/1934  Subjective/Objective:  Pt admitted for Heart Failure- Milrinone gtt. Palliative Consulted and plan will be for Hospice Residential Facility.                   Action/Plan: CSW assisting pt with disposition needs. Plan will be for d/c 07-01-15. No further needs from CM at this time.   Expected Discharge Date:                  Expected Discharge Plan:  Hospice Medical Facility  In-House Referral:  Clinical Social Work  Discharge planning Services  CM Consult  Post Acute Care Choice:  NA Choice offered to:  NA  DME Arranged:  N/A DME Agency:  NA  HH Arranged:  NA HH Agency:  NA  Status of Service:  Completed, signed off  Medicare Important Message Given:  Yes Date Medicare IM Given:    Medicare IM give by:    Date Additional Medicare IM Given:    Additional Medicare Important Message give by:     If discussed at Long Length of Stay Meetings, dates discussed:    Additional Comments:  Gala Lewandowsky, RN 07/01/2015, 3:04 PM

## 2015-07-01 NOTE — Discharge Summary (Addendum)
Advanced Heart Failure Team  Discharge Summary   Patient ID: Darren Nodal MRN: [id number], DOB/AGE: 1934-11-21 81 y.o. Admit date: 06/27/2015 D/C date:     07/01/2015   Primary Discharge Diagnoses:  1. Acute on chronic systolic HF with cardiogenic shock 2/2 NICM: Has been on home milrinone.  3. Acute on CKD IV 4. Severe MR 5. LBBB - 156m 6. Gout 7. Hyponatremia/hypokalemia 8. DNR  Hospital Course:  Ms. HLeisingeris a 89Fwith chronic systolic and diastolic heart failure on milrinone, LBBB, s/p ICD, severe MR, hypertension, pulmonary hypertensin, CKD 3 recently discharged twice with cardiogenic shock due to NICM.  Unfortunately, she was readmitted March 3rd with weakness, confusion and lethargy despite home milrinone. She was not volume overloaded and continued HF meds. CVP on admit was 7 and mixed venous sat was 77%. She was confused and unable to participate in her care. SBP was low so Bidil was stopped.   After a long discussion with her and her family [facility]  Palliative Care consulted and met with her family as well who confirmed desire for comfort care status. Her code status was change to DNR and milrinone was discontinued. ICD has been deactivated. Gold DNR completed. Plan to continue torsemide and spironolactone for comfort.   [facility]    Discharge Weight: 152 pounds Discharge Vitals: Blood pressure 91/51, pulse 91, temperature 98.2 F (36.8 C), temperature source Oral, resp. rate 22, height 5' 6"  (1.676 m), weight 152 lb 1.6 oz (68.992 kg), SpO2 100 %.  Labs: Lab Results  Component Value Date   WBC 5.8 06/30/2015   HGB 11.2* 06/30/2015   HCT 32.2* 06/30/2015   MCV 80.9 06/30/2015   PLT 247 06/30/2015     Recent Labs Lab 07/01/15 0440  NA 130*  K 3.9  CL 83*  CO2 34*  BUN 69*  CREATININE 2.44*  CALCIUM 9.2  GLUCOSE 102*   No results found for: CHOL, HDL, LDLCALC, TRIG BNP (last 3  results)  Recent Labs  06/09/15 1920 06/20/15 2314 06/28/15 0002  BNP >4500.0* >4500.0* 1267.7*    ProBNP (last 3 results) No results for input(s): PROBNP in the last 8760 hours.   Diagnostic Studies/Procedures   No results found.  Discharge Medications     Medication List    STOP taking these medications        amiodarone 200 MG tablet  Commonly known as:  PACERONE     aspirin 81 MG tablet     isosorbide-hydrALAZINE 20-37.5 MG tablet  Commonly known as:  BIDIL     milrinone 20 MG/100ML Soln infusion  Commonly known as:  PRIMACOR      TAKE these medications        allopurinol 100 MG tablet  Commonly known as:  ZYLOPRIM  Take 2 tablets (200 mg total) by mouth daily.     Potassium Chloride ER 20 MEQ Tbcr  Take 20 mEq by mouth daily.     spironolactone 25 MG tablet  Commonly known as:  ALDACTONE  Take 0.5 tablets (12.5 mg total) by mouth daily.     torsemide 20 MG tablet  Commonly known as:  DEMADEX  Take 2 tablets (40 mg total) by mouth 2 (two) times daily.        Disposition   The patient will be discharged in stable condition to home. Discharge Instructions    Diet - low sodium heart healthy    Complete  by:  As directed      Increase activity slowly    Complete by:  As directed      PICC line removal    Complete by:  As directed               Duration of Discharge Encounter: Greater than 35 minutes   Signed, Amy Clegg NP-C   07/01/2015, 3:03 PM   Patient seen and examined with Darrick Grinder, NP. We discussed all aspects of the encounter. I agree with the assessment and plan as stated above.   I have edited the above with my changes.  Bensimhon, Daniel,MD 3:07 PM   Per Dr Haroldine Laws Mrs Duquette has life expectancy 3-6 months.   Amy Clegg NP-C  11:11 AM ] Agree.  Bensimhon, Daniel,MD 12:22 AM

## 2015-07-01 NOTE — Progress Notes (Signed)
Pt BP is 87/54, pt has no complaint, she denies any dizziness, SOB or other discomfort she is sitting in her chair with family at bed side. Heart failure team paged. Will continue to monitor pt closely.  Colleen Can, RN

## 2015-07-01 NOTE — Progress Notes (Signed)
Patient ID: Michele Caldwell, female   DOB: June 24, 1934, 81 y.o.   MRN: [id number]    Advanced Heart Failure Rounding Note   Subjective:    Admitted with weakness and lethargy despite milrinone. Yesterday milrinone stopped. Palliative following. She has transitioned to NCB.    Feeling ok. Wants sausage biscuit.   Objective:   Weight Range:  Vital Signs:   Temp:  [97.7 F (36.5 C)-98.2 F (36.8 C)] 98 F (36.7 C) (03/07 0831) Pulse Rate:  [88-96] 88 (03/07 0831) Resp:  [18-24] 22 (03/07 0615) BP: (87-109)/(52-58) 87/54 mmHg (03/07 0831) SpO2:  [95 %-99 %] 99 % (03/07 0831) Last BM Date:  (patient cannot remember)  Weight change: Filed Weights   06/28/15 0131 06/29/15 0313 06/30/15 0239  Weight: 154 lb 11.2 oz (70.171 kg) 151 lb 14.4 oz (68.901 kg) 152 lb 1.6 oz (68.992 kg)    Intake/Output:   Intake/Output Summary (Last 24 hours) at 07/01/15 0919 Last data filed at 06/30/15 2224  Gross per 24 hour  Intake    120 ml  Output    950 ml  Net   -830 ml     Physical Exam: General: NAD. Sitting in the chair.  HEENT: normal Neck: supple. JVP 6-7. Carotids 2+ bilat; no bruits. No thyromegaly or nodule noted.  Cor: PMI laterally displaced. Mildly tachy regular + loud S3 Lungs: Clear, normal effort Abdomen: soft, NT, ND, no HSM. No bruits or masses. +BS  Extremities: no cyanosis, clubbing, rash, tr ankle edema. RUE PICC Neuro: Slow to respond to questions, alert to person and place,  cranial nerves grossly intact. moves all 4 extremities w/o difficulty. Affect pleasant  Telemetry: SR 90s   Labs: Basic Metabolic Panel:  Recent Labs Lab 06/27/15 2116 06/28/15 0002 06/28/15 0517 06/29/15 0342 06/30/15 0420 07/01/15 0440  NA 126*  --  130* 130* 130* 130*  K 3.5  --  3.3* 4.0 4.0 3.9  CL 76*  --  79* 79* 80* 83*  CO2 33*  --  33* 34* 33* 34*  GLUCOSE 142*  --  119* 118* 105* 102*  BUN 91*  --  93* 84* 82* 69*  CREATININE 2.26* 2.20* 2.35* 2.38* 2.56* 2.44*    CALCIUM 9.4  --  9.4 9.2 9.1 9.2  MG  --   --   --   --  1.7  --     Liver Function Tests: No results for input(s): AST, ALT, ALKPHOS, BILITOT, PROT, ALBUMIN in the last 168 hours. No results for input(s): LIPASE, AMYLASE in the last 168 hours.  Recent Labs Lab 06/29/15 0342  AMMONIA 19    CBC:  Recent Labs Lab 06/27/15 2116 06/28/15 0002 06/29/15 0545 06/30/15 0420  WBC 6.1 6.2 5.4 5.8  HGB 12.4 12.3 11.9* 11.2*  HCT 35.6* 35.6* 34.0* 32.2*  MCV 79.5 79.6 80.6 80.9  PLT 267 268 257 247    Cardiac Enzymes:  Recent Labs Lab 06/28/15 0002 06/28/15 0517 06/28/15 1121  TROPONINI 0.06* 0.09* 0.08*    BNP: BNP (last 3 results)  Recent Labs  06/09/15 1920 06/20/15 2314 06/28/15 0002  BNP >4500.0* >4500.0* 1267.7*    ProBNP (last 3 results) No results for input(s): PROBNP in the last 8760 hours.    Other results:  Imaging: No results found.   Medications:     Scheduled Medications: . allopurinol  200 mg Oral Daily  . amiodarone  200 mg Oral BID  . antiseptic oral rinse  7 mL Mouth Rinse BID  .  aspirin EC  81 mg Oral Daily  . isosorbide-hydrALAZINE  1 tablet Oral TID  . potassium chloride SA  20 mEq Oral Daily  . sodium chloride flush  3 mL Intravenous Q12H  . spironolactone  12.5 mg Oral Daily  . torsemide  40 mg Oral BID    Infusions:    PRN Medications: sodium chloride, acetaminophen, fentaNYL (SUBLIMAZE) injection, glycopyrrolate, ipratropium-albuterol, ondansetron (ZOFRAN) IV, sodium chloride flush   Assessment:   1. Acute on chronic systolic CHF 2/2 NICM: Has been on home milrinone.  3. Acute on CKD IV 4. Severe MR 5. LBBB - 6. Gout 7. Hyponatremia/hypokalemia 8. DNR  Plan/Discussion:   For now she is stable off milrinone. She has transitioned to NCB. Plan to discharge to [facility] versus SNF. Stop Bidil.    Possible D/C today.   Length of Stay: 4   Amy Clegg Np-C  07/01/2015, 9:19 AM  Advanced Heart  Failure Team Pager [phone] (M-F; 7a - 4p)  Please contact [facility] for night-coverage after hours (4p -7a ) and weekends on [email or web address]  Patient seen and examined with Tonye Becket, NP. We discussed all aspects of the encounter. I agree with the assessment and plan as stated above.   She has been switched to comfort care. Milrinone now off. Patient and family agreeable to Hospice Home placement. Discussed with Paliaitive Care Team. Will attempt to get to [facility] today or tomorrow. Continue diuretics. BP too soft for Bidil.   Ashyra Cantin,MD 11:20 AM

## 2015-07-01 NOTE — Progress Notes (Signed)
Daily Progress Note   Patient Name: Michele Caldwell       Date: 07/01/2015 DOB: 08/13/1934  Age: 81 y.o. MRN#: [id number] Attending Physician: Chilton Si, MD Primary Care Physician: No PCP Per Patient Admit Date: 06/27/2015  Reason for Consultation/Follow-up: Establishing goals of care  Subjective: Michele Caldwell appears basically the same as yesterday. She is lethargic but awakens. Appears to be comfortable with no complaints. Michele Caldwell and another granddaughter are at bedside. Michele Caldwell is tired but they confirm that family is aware and no issues with plan of care established confirmed by both of them. Will add prn for anxiety and constipation. Discussed comfort prn meds with family at bedside. All questions/concerns addressed. Plan for hospice facility and family researching the best facility for them.    Length of Stay: 4 days  Current Medications: Scheduled Meds:  . allopurinol  200 mg Oral Daily  . amiodarone  200 mg Oral BID  . antiseptic oral rinse  7 mL Mouth Rinse BID  . aspirin EC  81 mg Oral Daily  . isosorbide-hydrALAZINE  1 tablet Oral TID  . potassium chloride SA  20 mEq Oral Daily  . sodium chloride flush  3 mL Intravenous Q12H  . spironolactone  12.5 mg Oral Daily  . torsemide  40 mg Oral BID    Continuous Infusions:    PRN Meds: sodium chloride, acetaminophen, fentaNYL (SUBLIMAZE) injection, glycopyrrolate, ipratropium-albuterol, LORazepam, ondansetron (ZOFRAN) IV, sodium chloride flush  Physical Exam: Physical Exam  Constitutional: She appears well-developed. She appears lethargic.  HENT:  Head: Normocephalic and atraumatic.  Cardiovascular: Normal rate.   Pulmonary/Chest: Effort normal. No accessory muscle usage. No tachypnea. No respiratory distress.    Abdominal: Soft. Normal appearance.  Neurological: She appears lethargic.                Vital Signs: BP 87/54 mmHg  Pulse 88  Temp(Src) 98 F (36.7 C) (Oral)  Resp 22  Ht 5\' 6"  (1.676 m)  Wt 68.992 kg (152 lb 1.6 oz)  BMI 24.56 kg/m2  SpO2 99% SpO2: SpO2: 99 % O2 Device: O2 Device: Nasal Cannula O2 Flow Rate: O2 Flow Rate (L/min): 2 L/min  Intake/output summary:  Intake/Output Summary (Last 24 hours) at 07/01/15 0939 Last data filed at 06/30/15 2224  Gross per 24 hour  Intake  120 ml  Output    950 ml  Net   -830 ml   LBM: Last BM Date:  (patient cannot remember) Baseline Weight: Weight: 68.266 kg (150 lb 8 oz) Most recent weight: Weight:  (pt refused weigh)       Palliative Assessment/Data: Flowsheet Rows        Most Recent Value   Intake Tab    Referral Department  Cardiology   Unit at Time of Referral  Cardiac/Telemetry Unit   Palliative Care Primary Diagnosis  Cardiac   Date Notified  06/30/15   Palliative Care Type  New Palliative care   Reason for referral  Clarify Goals of Care   Date of Admission  06/27/15   # of days IP prior to Palliative referral  3   Clinical Assessment    Psychosocial & Spiritual Assessment    Palliative Care Outcomes       Additional Data Reviewed: CBC    Component Value Date/Time   WBC 5.8 06/30/2015 0420   RBC 3.98 06/30/2015 0420   HGB 11.2* 06/30/2015 0420   HCT 32.2* 06/30/2015 0420   PLT 247 06/30/2015 0420   MCV 80.9 06/30/2015 0420   MCH 28.1 06/30/2015 0420   MCHC 34.8 06/30/2015 0420   RDW 17.3* 06/30/2015 0420   LYMPHSABS 1.1 06/09/2015 1919   MONOABS 0.4 06/09/2015 1919   EOSABS 0.1 06/09/2015 1919   BASOSABS 0.0 06/09/2015 1919    CMP     Component Value Date/Time   NA 130* 07/01/2015 0440   K 3.9 07/01/2015 0440   CL 83* 07/01/2015 0440   CO2 34* 07/01/2015 0440   GLUCOSE 102* 07/01/2015 0440   BUN 69* 07/01/2015 0440   CREATININE 2.44* 07/01/2015 0440   CREATININE 1.65* 05/23/2015 1632    CALCIUM 9.2 07/01/2015 0440   PROT 7.3 06/20/2015 2314   ALBUMIN 3.0* 06/20/2015 2314   AST 40 06/20/2015 2314   ALT 19 06/20/2015 2314   ALKPHOS 51 06/20/2015 2314   BILITOT 3.9* 06/20/2015 2314   GFRNONAA 18* 07/01/2015 0440   GFRAA 21* 07/01/2015 0440       Problem List:  Patient Active Problem List   Diagnosis Date Noted  . Palliative care encounter   . Acute on chronic systolic heart failure, NYHA class 4 (HCC)   . Other encephalopathy   . Uremia   . Heart failure (HCC) 06/27/2015  . Cardiogenic shock (HCC)   . Acute on chronic systolic (congestive) heart failure (HCC)   . Acute on chronic renal failure (HCC)   . Hypokalemia   . PAH (pulmonary artery hypertension) (HCC)   . Moderate tricuspid regurgitation   . CKD (chronic kidney disease), stage III   . Chronic systolic CHF (congestive heart failure) (HCC)   . AICD (automatic cardioverter/defibrillator) present   . Hypertensive heart disease 06/11/2015  . Acute on chronic combined systolic and diastolic CHF (congestive heart failure) (HCC) 06/09/2015  . Severe mitral regurgitation 06/09/2015  . Ventricular flutter (HCC) 04/07/2010  . SYSTOLIC HEART FAILURE, CHRONIC 07/22/2009  . NICM (nonischemic cardiomyopathy) (HCC) 07/10/2008  . RENAL DISEASE, CHRONIC, STAGE II 07/10/2008  . LBBB 04/16/2008  . Automatic implantable cardioverter-defibrillator in situ 04/16/2008     Palliative Care Assessment & Plan    1.Code Status:  DNR    Code Status Orders        Start     Ordered   06/30/15 1250  Do not attempt resuscitation (DNR)   Continuous  Question Answer Comment  In the event of cardiac or respiratory ARREST Do not call a "code blue"   In the event of cardiac or respiratory ARREST Do not perform Intubation, CPR, defibrillation or ACLS   In the event of cardiac or respiratory ARREST Use medication by any route, position, wound care, and other measures to relive pain and suffering. May use oxygen, suction  and manual treatment of airway obstruction as needed for comfort.      06/30/15 1249    Code Status History    Date Active Date Inactive Code Status Order ID Comments User Context   06/27/2015 11:59 PM 06/30/2015 12:49 PM Full Code [id number]  Chilton Si, MD ED   06/20/2015 10:54 PM 06/25/2015  8:24 PM Full Code [id number]  Joellyn Rued, MD Inpatient   06/09/2015  6:50 PM 06/18/2015  8:47 PM Full Code [id number]  Beatrice Lecher, PA-C Inpatient       2. Goals of Care/Additional Recommendations:  Comfort care.   Limitations on Scope of Treatment: Full Comfort Care  Desire for further Chaplaincy support:yes  Psycho-social Needs: Caregiving  Support/Resources and Education on Hospice  3. Symptom Management:  Dyspnea/SOB: Fentanyl 12.5-25 mcg every 2 hours prn.   Secretions/gurgling: Robinul 0.2 mg every 4 hours prn.  Anxiety: Lorazepam 0.5 mg every 6 hours prn.   Constipation: Senokot-S 1-2 tablets daily prn.   4. Palliative Prophylaxis:   Bowel Regimen, Delirium Protocol, Frequent Pain Assessment and Oral Care  5. Prognosis: Hours - Days  6. Discharge Planning:  Hospice facility   Thank you for allowing the Palliative Medicine Team to assist in the care of this patient.   Time In: 0920 Time Out: 0940 Total Time Prolonged Time Billed  no         Ulice Bold, NP  07/01/2015, 9:39 AM  Please contact Palliative Medicine Team phone at [phone] for questions and concerns.

## 2015-07-02 ENCOUNTER — Inpatient Hospital Stay (HOSPITAL_COMMUNITY): Payer: Medicare Other

## 2015-07-02 MED ORDER — ALPRAZOLAM 0.25 MG PO TABS: 0.2500 mg | ORAL_TABLET | Freq: Three times a day (TID) | ORAL | Status: AC | PRN

## 2015-07-02 MED ORDER — ALPRAZOLAM 0.25 MG PO TABS
0.2500 mg | ORAL_TABLET | Freq: Once | ORAL | Status: AC
  Administered 2015-07-02: 0.25 mg via ORAL
  Filled 2015-07-02: qty 1

## 2015-07-02 MED ORDER — ALPRAZOLAM 0.25 MG PO TABS: 0.2500 mg | ORAL_TABLET | Freq: Three times a day (TID) | ORAL | Status: DC | PRN

## 2015-07-02 NOTE — Progress Notes (Signed)
   07/02/15 1255  Clinical Encounter Type  Visited With Patient and family together;Health care provider  Visit Type Follow-up  Referral From Palliative care team  Spiritual Encounters  Spiritual Needs Literature   Chaplain responded to a request from Palliative Care to assist with advanced directives. Chaplain facilitated the completion of that process, and there is now a copy of the HCPOA in the patient's chart, as well as the original and copies being given back to the patient. Chaplain support available as needed.   Alda Ponder, Chaplain 07/02/2015 12:56 PM

## 2015-07-02 NOTE — Progress Notes (Signed)
Report called to nurse Inetta Fermo at [facility]. Patient transferred via [facility] transport.  Michele Caldwell

## 2015-07-02 NOTE — Clinical Social Work Note (Signed)
Per MD patient ready for DC to [facility]. RN, patient, patient's family (daughter at bedside notified), and facility notified of DC. Addended DC Summary faxed to the facility. RN given number for report. DC packet on chart. Ambulance transport requested for patient for 1:00PM so that patient can eat lunch per daughter's request. CSW signing off at this time.   Roddie Mc MSW, [place], [place], [phone]

## 2015-07-02 NOTE — Clinical Social Work Note (Signed)
Patient spoke with [facility]. The patient does qualify for residential hospice placement and the facility can offer the patient a bed. The patient does not qualify for inpatient hospice for symptom management which is different from residential hospice placement. Daughter has spoken with the facility. Daughter shared that she is more inclined to take the patient home if there are no symptoms being managed, but would like to speak with the doctor before making this decision. CSW will assist with DC to residential hospice today if the family decides on this option.  Roddie Mc MSW, [place], [place], [phone]

## 2015-07-02 NOTE — Progress Notes (Signed)
Patient ID: Michele Caldwell, female   DOB: Sep 30, 1934, 81 y.o.   MRN: [id number]    Advanced Heart Failure Rounding Note   Subjective:    Admitted with weakness and lethargy despite milrinone. Yesterday milrinone stopped. Palliative following. She has transitioned to NCB.   Feeling OK. Appetite is great. She remains in house as "symptomatically" does not qualify for hospice residence.   Objective:   Weight Range:  Vital Signs:   Temp:  [98 F (36.7 C)-98.5 F (36.9 C)] 98.5 F (36.9 C) (03/08 0755) Pulse Rate:  [91-108] 97 (03/08 0755) Resp:  [19-20] 20 (03/08 0755) BP: (91-102)/(51-69) 91/51 mmHg (03/08 0755) SpO2:  [95 %-100 %] 96 % (03/08 0755) Weight:  [151 lb 1.6 oz (68.539 kg)-152 lb 4.8 oz (69.083 kg)] 151 lb 1.6 oz (68.539 kg) (03/08 0431) Last BM Date:  (per pt* has not had a bowel movement in days)  Weight change: Filed Weights   06/30/15 0239 07/01/15 1606 07/02/15 0431  Weight: 152 lb 1.6 oz (68.992 kg) 152 lb 4.8 oz (69.083 kg) 151 lb 1.6 oz (68.539 kg)    Intake/Output:   Intake/Output Summary (Last 24 hours) at 07/02/15 0831 Last data filed at 07/02/15 0545  Gross per 24 hour  Intake    270 ml  Output   1475 ml  Net  -1205 ml     Physical Exam: General: NAD. Sitting in the chair.  HEENT: normal Neck: supple. JVP 6-7. Carotids 2+ bilat; no bruits. No thyromegaly or nodule noted.  Cor: PMI laterally displaced. Mildly tachy regular + loud S3 Lungs: Clear, normal effort Abdomen: soft, NT, ND, no HSM. No bruits or masses. +BS  Extremities: no cyanosis, clubbing, rash, 1 + ankle edema. RUE PICC Neuro: Slow to respond to questions, alert to person and place,  cranial nerves grossly intact. moves all 4 extremities w/o difficulty. Affect pleasant  Telemetry: reviewed personally, SR 90s   Labs: Basic Metabolic Panel:  Recent Labs Lab 06/27/15 2116 06/28/15 0002 06/28/15 0517 06/29/15 0342 06/30/15 0420 07/01/15 0440  NA 126*  --  130* 130* 130*  130*  K 3.5  --  3.3* 4.0 4.0 3.9  CL 76*  --  79* 79* 80* 83*  CO2 33*  --  33* 34* 33* 34*  GLUCOSE 142*  --  119* 118* 105* 102*  BUN 91*  --  93* 84* 82* 69*  CREATININE 2.26* 2.20* 2.35* 2.38* 2.56* 2.44*  CALCIUM 9.4  --  9.4 9.2 9.1 9.2  MG  --   --   --   --  1.7  --     Liver Function Tests: No results for input(s): AST, ALT, ALKPHOS, BILITOT, PROT, ALBUMIN in the last 168 hours. No results for input(s): LIPASE, AMYLASE in the last 168 hours.  Recent Labs Lab 06/29/15 0342  AMMONIA 19    CBC:  Recent Labs Lab 06/27/15 2116 06/28/15 0002 06/29/15 0545 06/30/15 0420  WBC 6.1 6.2 5.4 5.8  HGB 12.4 12.3 11.9* 11.2*  HCT 35.6* 35.6* 34.0* 32.2*  MCV 79.5 79.6 80.6 80.9  PLT 267 268 257 247    Cardiac Enzymes:  Recent Labs Lab 06/28/15 0002 06/28/15 0517 06/28/15 1121  TROPONINI 0.06* 0.09* 0.08*    BNP: BNP (last 3 results)  Recent Labs  06/09/15 1920 06/20/15 2314 06/28/15 0002  BNP >4500.0* >4500.0* 1267.7*    ProBNP (last 3 results) No results for input(s): PROBNP in the last 8760 hours.    Other results:  Imaging: No results found.   Medications:     Scheduled Medications: . allopurinol  200 mg Oral Daily  . antiseptic oral rinse  7 mL Mouth Rinse BID  . potassium chloride SA  20 mEq Oral Daily  . sodium chloride flush  3 mL Intravenous Q12H  . spironolactone  12.5 mg Oral Daily  . torsemide  40 mg Oral BID    Infusions:    PRN Medications: sodium chloride, acetaminophen, fentaNYL (SUBLIMAZE) injection, glycopyrrolate, ipratropium-albuterol, LORazepam **OR** LORazepam, ondansetron (ZOFRAN) IV, senna-docusate, sodium chloride flush   Assessment:   1. Acute on chronic systolic CHF 2/2 NICM: Has been on home milrinone.  3. Acute on CKD IV 4. Severe MR 5. LBBB - 6. Gout 7. Hyponatremia/hypokalemia 8. DNR  Plan/Discussion:   For now she is stable off milrinone. She has transitioned to NCB. Plan to discharge to  [facility] versus SNF. Stop Bidil.   She has had repeated admissions for decompensation.  We strongly advocate against sending the patient to home. She needs hospice house.  Length of Stay: 5  Graciella Freer PA-C 07/02/2015, 8:31 AM  Advanced Heart Failure Team Pager [phone] (M-F; 7a - 4p)  Please contact [facility] for night-coverage after hours (4p -7a ) and weekends on [email or web address]  Patient seen and examined with Otilio Saber, PA-C. We discussed all aspects of the encounter. I agree with the assessment and plan as stated above.   She is currently stable off milrinone but remains very weak. Over the past two months she has had intractable end-stage HF with severe symptoms and multiple admissions despite inotropic support. She cannot go home. Will need impatient hospice on d/c to make sure we manage her symptoms adequately. It will not be long before she decompensates again particularly in the home setting.   Loryn Haacke,MD 9:24 AM

## 2015-07-02 NOTE — Plan of Care (Signed)
Problem: Pain Managment: Goal: General experience of comfort will improve Outcome: Progressing Thus far this shift patient has complained of mid back pain.  PRN tylenol administered which relieved patient's back pain.

## 2015-07-26 DEATH — deceased

## 2015-08-29 ENCOUNTER — Encounter: Payer: Medicare Other | Admitting: Nurse Practitioner

## 2017-12-09 IMAGING — CR DG CHEST 1V PORT
1 series · 1 of 1 positions shown · non-contrast
Comparison: 06/20/2015

CLINICAL DATA: Decreased breath sounds, no fever, history of
congestive heart failure and renal disease

EXAM:
PORTABLE CHEST 1 VIEW

[AP]
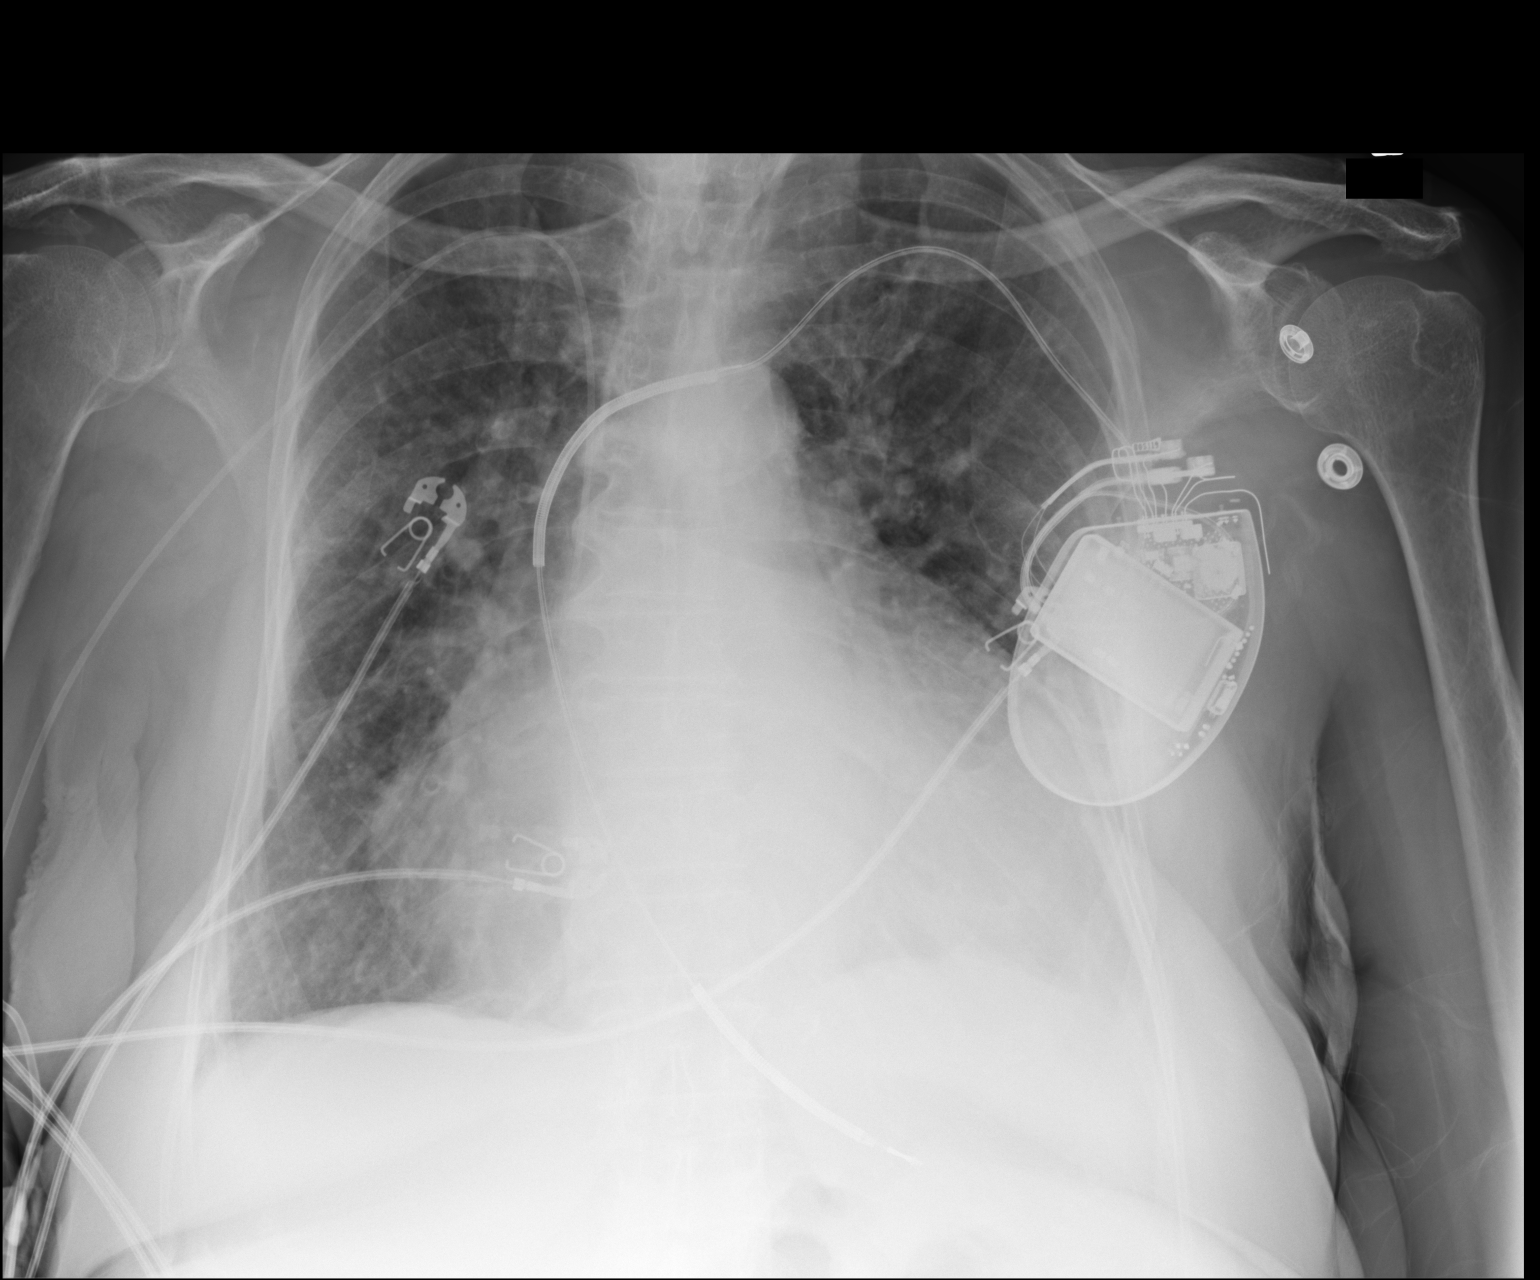

[1 of 1 positions shown; findings below may reference images not displayed]

FINDINGS: Significant enlargement of the cardiac silhouette stable. Single
lead defibrillator unchanged. Right PICC line new from prior study
with tip near the cavoatrial junction.

No pneumothorax. There is mild to moderate central vascular
congestion slightly less prominent when compared to the prior study.
No definite pulmonary edema.
IMPRESSION: Cardiac enlargement and vascular congestion but without definite
evidence of pulmonary edema.

## 2021-07-25 DEATH — deceased
